# Patient Record
Sex: Female | Born: 1960 | Race: Black or African American | Hispanic: No | Smoking: Current every day smoker
Health system: Southern US, Community
[De-identification: ages and names within clinical notes are randomized; demographics above are authoritative.]

## PROBLEM LIST (undated history)

## (undated) DIAGNOSIS — R51 Headache: Secondary | ICD-10-CM

## (undated) DIAGNOSIS — R519 Headache, unspecified: Secondary | ICD-10-CM

## (undated) DIAGNOSIS — G510 Bell's palsy: Secondary | ICD-10-CM

## (undated) DIAGNOSIS — H3341 Traction detachment of retina, right eye: Secondary | ICD-10-CM

## (undated) DIAGNOSIS — G709 Myoneural disorder, unspecified: Secondary | ICD-10-CM

## (undated) DIAGNOSIS — I10 Essential (primary) hypertension: Secondary | ICD-10-CM

## (undated) HISTORY — DX: Traction detachment of retina, right eye: H33.41

## (undated) HISTORY — DX: Myoneural disorder, unspecified: G70.9

## (undated) HISTORY — DX: Bell's palsy: G51.0

---

## 2007-03-12 HISTORY — PX: ABDOMINAL HYSTERECTOMY: SHX81

## 2008-06-23 DIAGNOSIS — D259 Leiomyoma of uterus, unspecified: Secondary | ICD-10-CM | POA: Insufficient documentation

## 2008-06-23 DIAGNOSIS — D500 Iron deficiency anemia secondary to blood loss (chronic): Secondary | ICD-10-CM | POA: Insufficient documentation

## 2009-11-19 DIAGNOSIS — R7301 Impaired fasting glucose: Secondary | ICD-10-CM | POA: Insufficient documentation

## 2009-11-19 DIAGNOSIS — E559 Vitamin D deficiency, unspecified: Secondary | ICD-10-CM | POA: Insufficient documentation

## 2009-11-19 DIAGNOSIS — N393 Stress incontinence (female) (male): Secondary | ICD-10-CM | POA: Insufficient documentation

## 2009-11-19 DIAGNOSIS — G4709 Other insomnia: Secondary | ICD-10-CM | POA: Insufficient documentation

## 2009-11-19 DIAGNOSIS — K219 Gastro-esophageal reflux disease without esophagitis: Secondary | ICD-10-CM | POA: Insufficient documentation

## 2009-11-19 DIAGNOSIS — K449 Diaphragmatic hernia without obstruction or gangrene: Secondary | ICD-10-CM | POA: Insufficient documentation

## 2009-11-19 DIAGNOSIS — G9009 Other idiopathic peripheral autonomic neuropathy: Secondary | ICD-10-CM | POA: Insufficient documentation

## 2009-11-19 DIAGNOSIS — K5730 Diverticulosis of large intestine without perforation or abscess without bleeding: Secondary | ICD-10-CM | POA: Insufficient documentation

## 2012-11-14 ENCOUNTER — Encounter (HOSPITAL_COMMUNITY): Payer: Self-pay | Admitting: Emergency Medicine

## 2012-11-14 ENCOUNTER — Emergency Department (HOSPITAL_COMMUNITY)
Admission: EM | Admit: 2012-11-14 | Discharge: 2012-11-14 | Disposition: A | Discharge status: Home / Self Care | Payer: Self-pay | Attending: Emergency Medicine | Admitting: Emergency Medicine

## 2012-11-14 DIAGNOSIS — I10 Essential (primary) hypertension: Secondary | ICD-10-CM | POA: Insufficient documentation

## 2012-11-14 DIAGNOSIS — F17200 Nicotine dependence, unspecified, uncomplicated: Secondary | ICD-10-CM | POA: Insufficient documentation

## 2012-11-14 DIAGNOSIS — Z3202 Encounter for pregnancy test, result negative: Secondary | ICD-10-CM | POA: Insufficient documentation

## 2012-11-14 DIAGNOSIS — A599 Trichomoniasis, unspecified: Secondary | ICD-10-CM | POA: Insufficient documentation

## 2012-11-14 DIAGNOSIS — H109 Unspecified conjunctivitis: Secondary | ICD-10-CM | POA: Insufficient documentation

## 2012-11-14 HISTORY — DX: Essential (primary) hypertension: I10

## 2012-11-14 LAB — PREGNANCY, URINE: Preg Test, Ur: NEGATIVE

## 2012-11-14 LAB — URINALYSIS, ROUTINE W REFLEX MICROSCOPIC
Glucose, UA: NEGATIVE mg/dL
Nitrite: NEGATIVE
Protein, ur: NEGATIVE mg/dL
pH: 5 (ref 5.0–8.0)

## 2012-11-14 LAB — WET PREP, GENITAL: Yeast Wet Prep HPF POC: NONE SEEN

## 2012-11-14 LAB — URINE MICROSCOPIC-ADD ON

## 2012-11-14 MED ORDER — METRONIDAZOLE 500 MG PO TABS
2000.0000 mg | ORAL_TABLET | Freq: Once | ORAL | Status: AC
  Administered 2012-11-14: 2000 mg via ORAL
  Filled 2012-11-14: qty 4

## 2012-11-14 MED ORDER — ERYTHROMYCIN 5 MG/GM OP OINT
TOPICAL_OINTMENT | Freq: Once | OPHTHALMIC | Status: AC
  Administered 2012-11-14: 1 via OPHTHALMIC

## 2012-11-14 NOTE — ED Provider Notes (Signed)
CSN: [id number]     Arrival date & time 11/14/12  1102 History   First MD Initiated Contact with Patient 11/14/12 1122     Chief Complaint  Patient presents with  . Hypertension  . Vaginal Discharge   (Consider location/radiation/quality/duration/timing/severity/associated sxs/prior Treatment) HPI Comments: Pt states that she had red eye redness and drainage that started in the left eye last week and it resolved and now she is having the same out of her right eye:pt state that she hasn't been taking bp medication but she just found out that she has a refill so she thought the eye redness was related to her bp being elevated:pt states that she is also here because she is having yellow vaginal discharge times 2 weeks:pt denies history of std  The history is provided by the patient. No language interpreter was used.    Past Medical History  Diagnosis Date  . Hypertension    History reviewed. No pertinent past surgical history. No family history on file. History  Substance Use Topics  . Smoking status: Current Every Day Smoker  . Smokeless tobacco: Not on file  . Alcohol Use: Yes   OB History   Grav Para Term Preterm Abortions TAB SAB Ect Mult Living                 Review of Systems  Constitutional: Negative.   Eyes: Positive for redness. Negative for photophobia and visual disturbance.  Respiratory: Negative.   Cardiovascular: Negative.     Allergies  Review of patient's allergies indicates not on file.  Home Medications  No current outpatient prescriptions on file. BP 160/89  Pulse 91  Temp(Src) 98.3 F (36.8 C) (Oral)  Resp 16  SpO2 97% Physical Exam  Nursing note and vitals reviewed. Constitutional: She is oriented to person, place, and time. She appears well-developed and well-nourished.  HENT:  Head: Normocephalic and atraumatic.  Eyes: EOM are normal. Pupils are equal, round, and reactive to light. Right conjunctiva is injected. Left conjunctiva is injected.   Neck: Normal range of motion. Neck supple.  Cardiovascular: Normal rate and regular rhythm.   Pulmonary/Chest: Effort normal and breath sounds normal.  Abdominal: Soft. Bowel sounds are normal. There is no tenderness.  Genitourinary:  Yellow vaginal discharge:no cmt  Musculoskeletal: Normal range of motion.  Neurological: She is alert and oriented to person, place, and time.  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    ED Course  Procedures (including critical care time) Labs Review Labs Reviewed  WET PREP, GENITAL - Abnormal; Notable for the following:    Trich, Wet Prep MANY (*)    WBC, Wet Prep HPF POC TOO NUMEROUS TO COUNT (*)    All other components within normal limits  URINALYSIS, ROUTINE W REFLEX MICROSCOPIC - Abnormal; Notable for the following:    Leukocytes, UA SMALL (*)    All other components within normal limits  URINE MICROSCOPIC-ADD ON - Abnormal; Notable for the following:    Squamous Epithelial / LPF FEW (*)    All other components within normal limits  GC/CHLAMYDIA PROBE AMP  PREGNANCY, URINE   Imaging Review No results found.  MDM   1. Conjunctivitis   2. Trichimoniasis    Educated pt on redness or eye not related to bp although stressed the importance of continuing to take medication:will treat conjunctivitis:pt treated for trich here:std cultures sent    Teressa Lower, NP 11/14/12 1305

## 2012-11-14 NOTE — ED Notes (Signed)
Pt. Stated, I need my BP checked and my eyes are red.  I also need to be checked for a vaginal discharge I've had for 2 weeks.

## 2012-11-14 NOTE — ED Notes (Signed)
Requested case manager speak to patient regarding possible resources at this time.

## 2012-11-14 NOTE — ED Provider Notes (Signed)
Medical screening examination/treatment/procedure(s) were performed by non-physician practitioner and as supervising physician I was immediately available for consultation/collaboration.  Shelda Jakes, MD 11/14/12 1311

## 2012-11-14 NOTE — Progress Notes (Signed)
Met patient at bedside.Role of Case manager explained-Patient verbalizes her understanding.Patient reports  Increased eye drainage / redness as reason for ED visit.Patient does not have a PCP. Resource sheet given To patient for the [facility].Communicated to patient I will e-mail clinic to help with PCP set up.Patient will call clinic next week to follow up.Patient educated on [employer] and provided with  A [facility] drug discount card.Patient given a [address] resource card.Teach back method used to  Verify patient understanding of education- resources.No further case manager needs identified.

## 2012-11-16 LAB — URINE CULTURE: Colony Count: 2000

## 2013-08-02 ENCOUNTER — Emergency Department (HOSPITAL_COMMUNITY)
Admission: EM | Admit: 2013-08-02 | Discharge: 2013-08-02 | Disposition: A | Discharge status: Home / Self Care | Payer: No Typology Code available for payment source | Attending: Emergency Medicine | Admitting: Emergency Medicine

## 2013-08-02 ENCOUNTER — Emergency Department (HOSPITAL_COMMUNITY): Payer: No Typology Code available for payment source

## 2013-08-02 ENCOUNTER — Encounter (HOSPITAL_COMMUNITY): Payer: Self-pay | Admitting: Emergency Medicine

## 2013-08-02 DIAGNOSIS — R5383 Other fatigue: Secondary | ICD-10-CM

## 2013-08-02 DIAGNOSIS — I10 Essential (primary) hypertension: Secondary | ICD-10-CM | POA: Insufficient documentation

## 2013-08-02 DIAGNOSIS — J45909 Unspecified asthma, uncomplicated: Secondary | ICD-10-CM

## 2013-08-02 DIAGNOSIS — R42 Dizziness and giddiness: Secondary | ICD-10-CM | POA: Insufficient documentation

## 2013-08-02 DIAGNOSIS — R5381 Other malaise: Secondary | ICD-10-CM | POA: Insufficient documentation

## 2013-08-02 DIAGNOSIS — J45902 Unspecified asthma with status asthmaticus: Secondary | ICD-10-CM | POA: Insufficient documentation

## 2013-08-02 DIAGNOSIS — F17200 Nicotine dependence, unspecified, uncomplicated: Secondary | ICD-10-CM | POA: Insufficient documentation

## 2013-08-02 LAB — CBC WITH DIFFERENTIAL/PLATELET
Basophils Absolute: 0 10*3/uL (ref 0.0–0.1)
Basophils Relative: 0 % (ref 0–1)
Eosinophils Absolute: 0.1 10*3/uL (ref 0.0–0.7)
Eosinophils Relative: 1 % (ref 0–5)
HCT: 43.1 % (ref 36.0–46.0)
Hemoglobin: 14.5 g/dL (ref 12.0–15.0)
LYMPHS ABS: 2.4 10*3/uL (ref 0.7–4.0)
LYMPHS PCT: 31 % (ref 12–46)
MCH: 32.2 pg (ref 26.0–34.0)
MCHC: 33.6 g/dL (ref 30.0–36.0)
MCV: 95.8 fL (ref 78.0–100.0)
Monocytes Absolute: 0.4 10*3/uL (ref 0.1–1.0)
Monocytes Relative: 6 % (ref 3–12)
NEUTROS ABS: 4.7 10*3/uL (ref 1.7–7.7)
NEUTROS PCT: 62 % (ref 43–77)
PLATELETS: 239 10*3/uL (ref 150–400)
RBC: 4.5 MIL/uL (ref 3.87–5.11)
RDW: 13.7 % (ref 11.5–15.5)
WBC: 7.6 10*3/uL (ref 4.0–10.5)

## 2013-08-02 LAB — COMPREHENSIVE METABOLIC PANEL
ALK PHOS: 75 U/L (ref 39–117)
ALT: 16 U/L (ref 0–35)
AST: 23 U/L (ref 0–37)
Albumin: 3.8 g/dL (ref 3.5–5.2)
BUN: 13 mg/dL (ref 6–23)
CO2: 23 meq/L (ref 19–32)
Calcium: 10.3 mg/dL (ref 8.4–10.5)
Chloride: 106 mEq/L (ref 96–112)
Creatinine, Ser: 0.64 mg/dL (ref 0.50–1.10)
GLUCOSE: 114 mg/dL — AB (ref 70–99)
POTASSIUM: 5 meq/L (ref 3.7–5.3)
SODIUM: 141 meq/L (ref 137–147)
TOTAL PROTEIN: 7 g/dL (ref 6.0–8.3)
Total Bilirubin: 0.4 mg/dL (ref 0.3–1.2)

## 2013-08-02 LAB — I-STAT TROPONIN, ED: Troponin i, poc: 0 ng/mL (ref 0.00–0.08)

## 2013-08-02 MED ORDER — LORATADINE 10 MG PO TABS: 10.0000 mg | ORAL_TABLET | Freq: Every day | ORAL | Status: DC

## 2013-08-02 MED ORDER — ALBUTEROL SULFATE HFA 108 (90 BASE) MCG/ACT IN AERS
2.0000 | INHALATION_SPRAY | RESPIRATORY_TRACT | Status: DC | PRN
  Administered 2013-08-02: 2 via RESPIRATORY_TRACT
  Filled 2013-08-02: qty 6.7

## 2013-08-02 NOTE — ED Provider Notes (Signed)
CSN: [id number]     Arrival date & time 08/02/13  1440 History   First MD Initiated Contact with Patient 08/02/13 1739     Chief Complaint  Patient presents with  . Shortness of Breath  . Dizziness     (Consider location/radiation/quality/duration/timing/severity/associated sxs/prior Treatment) HPI  Patient to the ER after having an episode of feeling as though her throat was tightening, it was difficult to get a good breath in and then she started to become dizzy and weak. She reports being outside with her son and his dog for a while when it all started. She said that she no longer has symptoms now after being in the waiting room for awhile. She says that she is allergic to some dogs but does not have asthma. She reports that this has happened a few times before but she doesn't remember what caused it and this is her first time being evaluated for it. She currently is not having any symptoms. Denies having any pain or syncope.   Past Medical History  Diagnosis Date  . Hypertension    History reviewed. No pertinent past surgical history. No family history on file. History  Substance Use Topics  . Smoking status: Current Every Day Smoker    Types: Cigarettes  . Smokeless tobacco: Not on file  . Alcohol Use: Yes   OB History   Grav Para Term Preterm Abortions TAB SAB Ect Mult Living                 Review of Systems   Review of Systems  Gen: no weight loss, fevers, chills, night sweats , + weakness Eyes: no discharge or drainage, no occular pain or visual changes  Nose: no epistaxis or rhinorrhea  Mouth: no dental pain, no sore throat  Neck: no neck pain  Lungs:No wheezing, coughing or hemoptysis, + difficulty breathing, throat tightness. CV: no chest pain, palpitations, dependent edema or orthopnea  Abd: no abdominal pain, nausea, vomiting, diarrhea GU: no dysuria or gross hematuria  MSK:  No muscle weakness or pain Neuro: no headache, no focal neurologic deficits   Skin: no rash or wounds Psyche: no complaints    Allergies  Review of patient's allergies indicates no known allergies.  Home Medications   Prior to Admission medications   Medication Sig Start Date End Date Taking? Authorizing Provider  Aspirin-Salicylamide-Caffeine (BC HEADACHE POWDER PO) Take 1 packet by mouth daily as needed (pain).   Yes Historical Provider, MD   BP 143/81  Pulse 76  Temp(Src) 98.2 F (36.8 C) (Oral)  Resp 18  SpO2 96% Physical Exam  Constitutional: She appears well-developed and well-nourished.  HENT:  Head: Normocephalic and atraumatic.  Eyes: Conjunctivae are normal. Pupils are equal, round, and reactive to light.  Neck: Trachea normal, normal range of motion and full passive range of motion without pain. Neck supple.  Cardiovascular: Normal rate, regular rhythm and normal pulses.   Pulmonary/Chest: Effort normal and breath sounds normal. Chest wall is not dull to percussion. She exhibits no tenderness, no crepitus, no edema, no deformity and no retraction.  Abdominal: Soft. Normal appearance and bowel sounds are normal.  Musculoskeletal: Normal range of motion.  Neurological: She is alert. She has normal strength.  Skin: Skin is warm, dry and intact.  Psychiatric: She has a normal mood and affect. Her speech is normal and behavior is normal. Judgment and thought content normal. Cognition and memory are normal.    ED Course  Procedures (including critical care  time) Labs Review Labs Reviewed  COMPREHENSIVE METABOLIC PANEL - Abnormal; Notable for the following:    Glucose, Bld 114 (*)    All other components within normal limits  CBC WITH DIFFERENTIAL  Randolm Idol, ED    Imaging Review Dg Chest 2 View  08/02/2013   CLINICAL DATA:  Short of breath, dizzy, chest pain  EXAM: CHEST  2 VIEW  COMPARISON:  None.  FINDINGS: The lungs are clear and negative for focal airspace consolidation, pulmonary edema or suspicious pulmonary nodule. No  pleural effusion or pneumothorax. Cardiac and mediastinal contours are within normal limits. No acute fracture or lytic or blastic osseous lesions. The visualized upper abdominal bowel gas pattern is unremarkable.  IMPRESSION: No active cardiopulmonary disease.   Electronically Signed   By: Jacqulynn Cadet M.D.   On: 08/02/2013 15:55     EKG Interpretation None      MDM   Final diagnoses:  Allergy-induced asthma   i watched patient ambulate in exam room and then fat her. Her symptoms resolved after removing herself from the dog and the outdoor elements. She says that this happened sometimes but she never remembers associating it with anything.  She currently has no abnormal physical exam findings. Her lab work was unremarkable her chest x-ray was normal. We'll give her albuterol inhaler and start her on Claritin. She is to follow-up with PCP   53 y.o.Earney [employer] evaluation in the Emergency Department is complete. It has been determined that no acute conditions requiring further emergency intervention are present at this time. The patient/guardian have been advised of the diagnosis and plan. We have discussed signs and symptoms that warrant return to the ED, such as changes or worsening in symptoms.  Vital signs are stable at discharge. Filed Vitals:   08/02/13 1801  BP: 143/81  Pulse: 76  Temp:   Resp: 18    Patient/guardian has voiced understanding and agreed to follow-up with the PCP or specialist.    Linus Mako, PA-C 08/02/13 2346

## 2013-08-02 NOTE — ED Notes (Signed)
MD at bedside. Carlota Raspberry PA

## 2013-08-02 NOTE — Discharge Instructions (Signed)
Bronchospasm, Adult A bronchospasm is a spasm or tightening of the airways going into the lungs. During a bronchospasm breathing becomes more difficult because the airways get smaller. When this happens there can be coughing, a whistling sound when breathing (wheezing), and difficulty breathing. Bronchospasm is often associated with asthma, but not all patients who experience a bronchospasm have asthma. CAUSES  A bronchospasm is caused by inflammation or irritation of the airways. The inflammation or irritation may be triggered by:   Allergies (such as to animals, pollen, food, or mold). Allergens that cause bronchospasm may cause wheezing immediately after exposure or many hours later.   Infection. Viral infections are believed to be the most common cause of bronchospasm.   Exercise.   Irritants (such as pollution, cigarette smoke, strong odors, aerosol sprays, and paint fumes).   Weather changes. Winds increase molds and pollens in the air. Rain refreshes the air by washing irritants out. Cold air may cause inflammation.   Stress and emotional upset.  SIGNS AND SYMPTOMS   Wheezing.   Excessive nighttime coughing.   Frequent or severe coughing with a simple cold.   Chest tightness.   Shortness of breath.  DIAGNOSIS  Bronchospasm is usually diagnosed through a history and physical exam. Tests, such as chest X-rays, are sometimes done to look for other conditions. TREATMENT   Inhaled medicines can be given to open up your airways and help you breathe. The medicines can be given using either an inhaler or a nebulizer machine.  Corticosteroid medicines may be given for severe bronchospasm, usually when it is associated with asthma. HOME CARE INSTRUCTIONS   Always have a plan prepared for seeking medical care. Know when to call your health care provider and local emergency services (911 in the [place] Know where you can access local emergency care.  Only take medicines  as directed by your health care provider.  If you were prescribed an inhaler or nebulizer machine, ask your health care provider to explain how to use it correctly. Always use a spacer with your inhaler if you were given one.  It is necessary to remain calm during an attack. Try to relax and breathe more slowly.  Control your home environment in the following ways:   Change your heating and air conditioning filter at least once a month.   Limit your use of fireplaces and Cobb stoves.  Do not smoke and do not allow smoking in your home.   Avoid exposure to perfumes and fragrances.   Get rid of pests (such as roaches and mice) and their droppings.   Throw away plants if you see mold on them.   Keep your house clean and dust free.   Replace carpet with Z[place], tile, or vinyl flooring. Carpet can trap dander and dust.   Use allergy-proof pillows, mattress covers, and box spring covers.   Wash bed sheets and blankets every week in hot water and dry them in a dryer.   Use blankets that are made of polyester or cotton.   Wash hands frequently. SEEK MEDICAL CARE IF:   You have muscle aches.   You have chest pain.   The sputum changes from clear or white to yellow, green, gray, or bloody.   The sputum you cough up gets thicker.   There are problems that may be related to the medicine you are given, such as a rash, itching, swelling, or trouble breathing.  SEEK IMMEDIATE MEDICAL CARE IF:   You have worsening wheezing and coughing  even after taking your prescribed medicines.   You have increased difficulty breathing.   You develop severe chest pain. MAKE SURE YOU:   Understand these instructions.  Will watch your condition.  Will get help right away if you are not doing well or get worse. Document Released: 02/28/2003 Document Revised: 10/28/2012 Document Reviewed: 08/17/2012 [facility] Patient Information 2014 [place].  How to Use an  Inhaler Proper inhaler technique is very important. Good technique ensures that the medicine reaches the lungs. Poor technique results in depositing the medicine on the tongue and back of the throat rather than in the airways. If you do not use the inhaler with good technique, the medicine will not help you. STEPS TO FOLLOW IF USING AN INHALER WITHOUT AN EXTENSION TUBE 1. Remove the cap from the inhaler. 2. If you are using the inhaler for the first time, you will need to prime it. Shake the inhaler for 5 seconds and release four puffs into the air, away from your face. Ask your health care provider or pharmacist if you have questions about priming your inhaler. 3. Shake the inhaler for 5 seconds before each breath in (inhalation). 4. Position the inhaler so that the top of the canister faces up. 5. Put your index finger on the top of the medicine canister. Your thumb supports the bottom of the inhaler. 6. Open your mouth. 7. Either place the inhaler between your teeth and place your lips tightly around the mouthpiece, or hold the inhaler 1 2 inches away from your open mouth. If you are unsure of which technique to use, ask your health care provider. 8. Breathe out (exhale) normally and as completely as possible. 9. Press the canister down with your index finger to release the medicine. 10. At the same time as the canister is pressed, inhale deeply and slowly until your lungs are completely filled. This should take 4 6 seconds. Keep your tongue down. 11. Hold the medicine in your lungs for 5 10 seconds (10 seconds is best). This helps the medicine get into the small airways of your lungs. 12. Breathe out slowly, through pursed lips. Whistling is an example of pursed lips. 13. Wait at least 15 30 seconds between puffs. Continue with the above steps until you have taken the number of puffs your health care provider has ordered. Do not use the inhaler more than your health care provider tells  you. 14. Replace the cap on the inhaler. 15. Follow the directions from your health care provider or the inhaler insert for cleaning the inhaler. STEPS TO FOLLOW IF USING AN INHALER WITH AN EXTENSION (SPACER) 1. Remove the cap from the inhaler. 2. If you are using the inhaler for the first time, you will need to prime it. Shake the inhaler for 5 seconds and release four puffs into the air, away from your face. Ask your health care provider or pharmacist if you have questions about priming your inhaler. 3. Shake the inhaler for 5 seconds before each breath in (inhalation). 4. Place the open end of the spacer onto the mouthpiece of the inhaler. 5. Position the inhaler so that the top of the canister faces up and the spacer mouthpiece faces you. 6. Put your index finger on the top of the medicine canister. Your thumb supports the bottom of the inhaler and the spacer. 7. Breathe out (exhale) normally and as completely as possible. 8. Immediately after exhaling, place the spacer between your teeth and into your mouth. Close your  lips tightly around the spacer. 9. Press the canister down with your index finger to release the medicine. 10. At the same time as the canister is pressed, inhale deeply and slowly until your lungs are completely filled. This should take 4 6 seconds. Keep your tongue down and out of the way. 11. Hold the medicine in your lungs for 5 10 seconds (10 seconds is best). This helps the medicine get into the small airways of your lungs. Exhale. 12. Repeat inhaling deeply through the spacer mouthpiece. Again hold that breath for up to 10 seconds (10 seconds is best). Exhale slowly. If it is difficult to take this second deep breath through the spacer, breathe normally several times through the spacer. Remove the spacer from your mouth. 13. Wait at least 15 30 seconds between puffs. Continue with the above steps until you have taken the number of puffs your health care provider has  ordered. Do not use the inhaler more than your health care provider tells you. 14. Remove the spacer from the inhaler, and place the cap on the inhaler. 15. Follow the directions from your health care provider or the inhaler insert for cleaning the inhaler and spacer. If you are using different kinds of inhalers, use your quick relief medicine to open the airways 10 15 minutes before using a steroid if instructed to do so by your health care provider. If you are unsure which inhalers to use and the order of using them, ask your health care provider, nurse, or respiratory therapist. If you are using a steroid inhaler, always rinse your mouth with water after your last puff, then gargle and spit out the water. Do not swallow the water. AVOID:  Inhaling before or after starting the spray of medicine. It takes practice to coordinate your breathing with triggering the spray.  Inhaling through the nose (rather than the mouth) when triggering the spray. HOW TO DETERMINE IF YOUR INHALER IS FULL OR NEARLY EMPTY You cannot know when an inhaler is empty by shaking it. A few inhalers are now being made with dose counters. Ask your health care provider for a prescription that has a dose counter if you feel you need that extra help. If your inhaler does not have a counter, ask your health care provider to help you determine the date you need to refill your inhaler. Write the refill date on a calendar or your inhaler canister. Refill your inhaler 7 10 days before it runs out. Be sure to keep an adequate supply of medicine. This includes making sure it is not expired, and that you have a spare inhaler.  SEEK MEDICAL CARE IF:   Your symptoms are only partially relieved with your inhaler.  You are having trouble using your inhaler.  You have some increase in phlegm. SEEK IMMEDIATE MEDICAL CARE IF:   You feel little or no relief with your inhalers. You are still wheezing and are feeling shortness of breath or  tightness in your chest or both.  You have dizziness, headaches, or a fast heart rate.  You have chills, fever, or night sweats.  You have a noticeable increase in phlegm production, or there is blood in the phlegm. MAKE SURE YOU:   Understand these instructions.  Will watch your condition.  Will get help right away if you are not doing well or get worse. Document Released: 02/23/2000 Document Revised: 12/16/2012 Document Reviewed: 09/24/2012 [facility] Patient Information 2014 [place], [place].

## 2013-08-02 NOTE — ED Notes (Signed)
Gave PT education re: inhaler use

## 2013-08-02 NOTE — ED Notes (Addendum)
Pt presents to department for evaluation of SOB. Onset this morning. Denies chest pain at the time. Respirations unlabored. Speaking complete sentences. Pt is alert and oriented x4. Skin clammy upon arrival.

## 2013-08-03 NOTE — ED Provider Notes (Signed)
Medical screening examination/treatment/procedure(s) were performed by non-physician practitioner and as supervising physician I was immediately available for consultation/collaboration.   EKG Interpretation None       Jasper Riling. Alvino Chapel, MD 08/03/13 2355

## 2015-06-11 ENCOUNTER — Emergency Department (HOSPITAL_COMMUNITY)
Admission: EM | Admit: 2015-06-11 | Discharge: 2015-06-11 | Disposition: A | Discharge status: Home / Self Care | Payer: No Typology Code available for payment source | Attending: Emergency Medicine | Admitting: Emergency Medicine

## 2015-06-11 ENCOUNTER — Encounter (HOSPITAL_COMMUNITY): Payer: Self-pay | Admitting: Emergency Medicine

## 2015-06-11 DIAGNOSIS — Z79899 Other long term (current) drug therapy: Secondary | ICD-10-CM | POA: Insufficient documentation

## 2015-06-11 DIAGNOSIS — I10 Essential (primary) hypertension: Secondary | ICD-10-CM | POA: Insufficient documentation

## 2015-06-11 DIAGNOSIS — F17210 Nicotine dependence, cigarettes, uncomplicated: Secondary | ICD-10-CM | POA: Insufficient documentation

## 2015-06-11 DIAGNOSIS — K047 Periapical abscess without sinus: Secondary | ICD-10-CM

## 2015-06-11 DIAGNOSIS — IMO0001 Reserved for inherently not codable concepts without codable children: Secondary | ICD-10-CM

## 2015-06-11 DIAGNOSIS — K029 Dental caries, unspecified: Secondary | ICD-10-CM | POA: Insufficient documentation

## 2015-06-11 DIAGNOSIS — R030 Elevated blood-pressure reading, without diagnosis of hypertension: Secondary | ICD-10-CM

## 2015-06-11 DIAGNOSIS — K08409 Partial loss of teeth, unspecified cause, unspecified class: Secondary | ICD-10-CM | POA: Insufficient documentation

## 2015-06-11 MED ORDER — PENICILLIN V POTASSIUM 500 MG PO TABS: 500.0000 mg | ORAL_TABLET | Freq: Four times a day (QID) | ORAL | Status: AC

## 2015-06-11 MED ORDER — HYDROCHLOROTHIAZIDE 12.5 MG PO TABS: 12.5000 mg | ORAL_TABLET | Freq: Every day | ORAL | Status: DC

## 2015-06-11 MED ORDER — IBUPROFEN 600 MG PO TABS: 600.0000 mg | ORAL_TABLET | Freq: Four times a day (QID) | ORAL | Status: DC | PRN

## 2015-06-11 NOTE — ED Provider Notes (Signed)
CSN: [id number]     Arrival date & time 06/11/15  1101 History   First MD Initiated Contact with Patient 06/11/15 1303     Chief Complaint  Patient presents with  . Facial Swelling     (Consider location/radiation/quality/duration/timing/severity/associated sxs/prior Treatment) HPI Kristi Russell is a 55 y.o. female with history of hypertension, comes in for evaluation of facial swelling. Patient reports over the past 2 days she started to have a mild toothache and noticed her left lower jaw started swelling. She reports she has not had insurance or a job for some time, but recently secured a job and will start receiving benefits on May 1. She has not taken any medications to improve her symptoms. Nothing seems to make the problem better or worse. She denies any overt facial pain, eye pain, fevers or chills, difficulties swallowing or breathing, sore throat, difficulties opening her jaw. She does have elevated blood pressure but denies any headache, numbness or weakness, vision changes, chest pain, shortness of breath, changes in urinary habits.  Past Medical History  Diagnosis Date  . Hypertension    Past Surgical History  Procedure Laterality Date  . Abdominal hysterectomy     No family history on file. Social History  Substance Use Topics  . Smoking status: Current Every Day Smoker    Types: Cigarettes  . Smokeless tobacco: None  . Alcohol Use: Yes   OB History    No data available     Review of Systems A 10 point review of systems was completed and was negative except for pertinent positives and negatives as mentioned in the history of present illness     Allergies  Review of patient's allergies indicates no known allergies.  Home Medications   Prior to Admission medications   Medication Sig Start Date End Date Taking? Authorizing Provider  Aspirin-Salicylamide-Caffeine (BC HEADACHE POWDER PO) Take 1 packet by mouth daily as needed (pain).    Historical Provider, MD   hydrochlorothiazide (HYDRODIURIL) 12.5 MG tablet Take 1 tablet (12.5 mg total) by mouth daily. 06/11/15   Comer Locket, PA-C  ibuprofen (ADVIL,MOTRIN) 600 MG tablet Take 1 tablet (600 mg total) by mouth every 6 (six) hours as needed. 06/11/15   Comer Locket, PA-C  loratadine (CLARITIN) 10 MG tablet Take 1 tablet (10 mg total) by mouth daily. 08/02/13   Delos Haring, PA-C  penicillin v potassium (VEETID) 500 MG tablet Take 1 tablet (500 mg total) by mouth 4 (four) times daily. 06/11/15 06/18/15  Comer Locket, PA-C   BP 193/115 mmHg  Pulse 78  Temp(Src) 98.2 F (36.8 C) (Oral)  Resp 16  Ht 5\' 3"  (1.6 m)  Wt 85.73 kg  BMI 33.49 kg/m2  SpO2 99% Physical Exam  Constitutional: She appears well-developed and well-nourished. No distress.  HENT:  Head: Normocephalic.  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  Mild to moderate diffuse swelling throughout left anterior mandible and maxillary space. No erythema, fluctuance.  Dental Discomfort located to left mandibular canine and premolars. Overall poor dentition. Multiple missing teeth with active caries. Mucous membranes are moist. No unilateral tonsillar swelling, uvula midline, no glossal swelling or elevation. No trismus. No fluctuance or evidence of a drainable abscess. No other evidence of emergent infection, Retropharyngeal or Peritonsillar abscess, Ludwig or Vincents angina. Tolerating secretions well. Patent airway   Eyes: Conjunctivae and EOM are normal. Right eye exhibits no discharge. Left eye exhibits no discharge. No  scleral icterus.  Neck: Normal range of motion. Neck supple.  Pulmonary/Chest: Effort normal. No respiratory distress.  Abdominal: Soft. She exhibits no distension.  Musculoskeletal: Normal range of motion.  Neurological: She is alert.  Skin: She is not diaphoretic.  Psychiatric: She has a normal mood and affect.    ED Course  Procedures  (including critical care time) Labs Review Labs Reviewed - No data to display  Imaging Review No results found. I have personally reviewed and evaluated these images and lab results as part of my medical decision-making.   EKG Interpretation None     Meds given in ED:  Medications - No data to display  Discharge Medication List as of 06/11/2015  2:23 PM    START taking these medications   Details  hydrochlorothiazide (HYDRODIURIL) 12.5 MG tablet Take 1 tablet (12.5 mg total) by mouth daily., Starting 06/11/2015, Until Discontinued, Print    ibuprofen (ADVIL,MOTRIN) 600 MG tablet Take 1 tablet (600 mg total) by mouth every 6 (six) hours as needed., Starting 06/11/2015, Until Discontinued, Print    penicillin v potassium (VEETID) 500 MG tablet Take 1 tablet (500 mg total) by mouth 4 (four) times daily., Starting 06/11/2015, Until Sun 06/18/15, Print       Filed Vitals:   06/11/15 1106 06/11/15 1315 06/11/15 1441  BP: 207/124 189/121 193/115  Pulse: 93 77 78  Temp: 98.2 F (36.8 C)    TempSrc: Oral    Resp: 16 18 16   Height: 5\' 3"  (1.6 m)    Weight: 85.73 kg    SpO2: 100% 98% 99%    MDM  Patient presents for evaluation of facial swelling secondary to dental infection. No evidence of drainable abscess, Ludwig or vincents angina, peritonsillar or retropharyngeal abscess or other deep space infection. No evidence of facial cellulitis. Plan to initiate antibiotic therapy. Patient also has elevated blood pressure, discussed good Rx app, we will initiate antihypertensive medication, she reports she will be up to follow-up with on site doctor at her job next week. Strict return precautions discussed. Given referral for PCP and outpatient dentistry. She verbalizes understanding and agrees with this plan as well as subsequent discharge. Final diagnoses:  Elevated blood pressure  Dental infection        Comer Locket, PA-C 06/11/15 1533  Tanna Furry, MD 06/15/15 [phone]

## 2015-06-11 NOTE — ED Notes (Signed)
Pt presents with L side facial swelling. She advises it started yesterday and was worse when she woke up this morning. Pt states she knows she has a bad tooth on the bottom L side but has not had any dental care in many years. Pt denies pain.

## 2015-06-11 NOTE — Discharge Instructions (Signed)
Take your medications as prescribed. Follow-up with your doctor in the next 3 or 4 days were a blood pressure recheck. He will use to test resource guide to help find a dentist for definitive care. Take all of your antibiotics as prescribed and do not save or share them. Return to the ED for any new, worsening or other concerning symptoms as we discussed.  DASH Eating Plan DASH stands for "Dietary Approaches to Stop Hypertension." The DASH eating plan is a healthy eating plan that has been shown to reduce high blood pressure (hypertension). Additional health benefits may include reducing the risk of type 2 diabetes mellitus, heart disease, and stroke. The DASH eating plan may also help with weight loss. WHAT DO I NEED TO KNOW ABOUT THE DASH EATING PLAN? For the DASH eating plan, you will follow these general guidelines:  Choose foods with a percent daily value for sodium of less than 5% (as listed on the food label).  Use salt-free seasonings or herbs instead of table salt or sea salt.  Check with your health care provider or pharmacist before using salt substitutes.  Eat lower-sodium products, often labeled as "lower sodium" or "no salt added."  Eat fresh foods.  Eat more vegetables, fruits, and low-fat dairy products.  Choose whole grains. Look for the word "whole" as the first word in the ingredient list.  Choose fish and skinless chicken or [place] more often than red meat. Limit fish, poultry, and meat to 6 oz (170 g) each day.  Limit sweets, desserts, sugars, and sugary drinks.  Choose heart-healthy fats.  Limit cheese to 1 oz (28 g) per day.  Eat more home-cooked food and less restaurant, buffet, and fast food.  Limit fried foods.  Cook foods using methods other than frying.  Limit canned vegetables. If you do use them, rinse them well to decrease the sodium.  When eating at a restaurant, ask that your food be prepared with less salt, or no salt if possible. WHAT FOODS  CAN I EAT? Seek help from a dietitian for individual calorie needs. Grains Whole grain or whole wheat bread. Brown rice. Whole grain or whole wheat pasta. Quinoa, bulgur, and whole grain cereals. Low-sodium cereals. Corn or whole wheat flour tortillas. Whole grain cornbread. Whole grain crackers. Low-sodium crackers. Vegetables Fresh or frozen vegetables (raw, steamed, roasted, or grilled). Low-sodium or reduced-sodium tomato and vegetable juices. Low-sodium or reduced-sodium tomato sauce and paste. Low-sodium or reduced-sodium canned vegetables.  Fruits All fresh, canned (in natural juice), or frozen fruits. Meat and Other Protein Products Ground beef (85% or leaner), grass-fed beef, or beef trimmed of fat. Skinless chicken or [place]. Ground chicken or [place]. Pork trimmed of fat. All fish and seafood. Eggs. Dried beans, peas, or lentils. Unsalted nuts and seeds. Unsalted canned beans. Dairy Low-fat dairy products, such as skim or 1% milk, 2% or reduced-fat cheeses, low-fat ricotta or cottage cheese, or plain low-fat yogurt. Low-sodium or reduced-sodium cheeses. Fats and Oils Tub margarines without trans fats. Light or reduced-fat mayonnaise and salad dressings (reduced sodium). Avocado. Safflower, olive, or canola oils. Natural peanut or almond butter. Other Unsalted popcorn and pretzels. The items listed above may not be a complete list of recommended foods or beverages. Contact your dietitian for more options. WHAT FOODS ARE NOT RECOMMENDED? Grains White bread. White pasta. White rice. Refined cornbread. Bagels and croissants. Crackers that contain trans fat. Vegetables Creamed or fried vegetables. Vegetables in a cheese sauce. Regular canned vegetables. Regular canned tomato sauce and  paste. Regular tomato and vegetable juices. Fruits Dried fruits. Canned fruit in light or heavy syrup. Fruit juice. Meat and Other Protein Products Fatty cuts of meat. Ribs, chicken wings, bacon, sausage,  bologna, salami, chitterlings, fatback, hot dogs, bratwurst, and packaged luncheon meats. Salted nuts and seeds. Canned beans with salt. Dairy Whole or 2% milk, cream, half-and-half, and cream cheese. Whole-fat or sweetened yogurt. Full-fat cheeses or blue cheese. Nondairy creamers and whipped toppings. Processed cheese, cheese spreads, or cheese curds. Condiments Onion and garlic salt, seasoned salt, table salt, and sea salt. Canned and packaged gravies. Worcestershire sauce. Tartar sauce. Barbecue sauce. Teriyaki sauce. Soy sauce, including reduced sodium. Steak sauce. Fish sauce. Oyster sauce. Cocktail sauce. Horseradish. Ketchup and mustard. Meat flavorings and tenderizers. Bouillon cubes. Hot sauce. Tabasco sauce. Marinades. Taco seasonings. Relishes. Fats and Oils Butter, stick margarine, lard, shortening, ghee, and bacon fat. Coconut, palm kernel, or palm oils. Regular salad dressings. Other Pickles and olives. Salted popcorn and pretzels. The items listed above may not be a complete list of foods and beverages to avoid. Contact your dietitian for more information. WHERE CAN I FIND MORE INFORMATION? National Heart, Lung, and Blood Institute: travelstabloid.com   This information is not intended to replace advice given to you by your health care provider. Make sure you discuss any questions you have with your health care provider.      [facility]  [address]  [place], [place] [address]  Phone [phone]  [facility] in [place], [place], exemplifies the [employer] vision to improve the health and quality of life of all [place] by Regulatory affairs officer with a passion to care for the underserved and by leading the nation in community-based, service learning oral  health education. We are committed to offering comprehensive general dental services for adults, children and special needs patients in a safe, caring and professional setting.  Appointments: Our clinic is open Monday through Friday 8:00 a.m. until 5:00 p.m. The amount of time scheduled for an appointment depends on the patients specific needs. We ask that you keep your appointed time for care or provide 24-hour notice of all appointment changes. Parents or legal guardians must accompany minor children.  Payment for Services: Medicaid and other insurance plans are welcome. Payment for services is due when services are rendered and may be made by cash or credit card. If you have dental insurance, we will assist you with your claim submission.   Emergencies: Emergency services will be provided Monday through Friday on a walk-in basis. Please arrive early for emergency services. After hours emergency services will be provided for patients of record as required.  Services:  Comprehensive General Dentistry  Childrens Dentistry  Oral Surgery - Extractions  Root Canals  Sealants and Tooth Colored Fillings  Crowns and Bridges  Dentures and Partial Dentures  Implant Services  Periodontal Services and Cleanings  Cosmetic Tooth Whitening  Digital Radiography  3-D/[facility] Beam Imaging   Document Released: 02/14/2011 Document Revised: 03/18/2014 Document Reviewed: 12/30/2012 Elsevier Interactive Patient Education 2016 [place] [employer] is a great source of information about community services available.  Access by dialing 2-1-1 from anywhere in [place], or by website -  CustodianSupply.fi.   Other Local Resources (Updated 03/2015)  Dental  Care   Services    Phone Number and Address  Cost  [facility] For children 43 - 75 years of age:   Cleaning  Tooth brushing/flossing instruction  Sealants,  fillings, crowns  Extractions  Emergency treatment  [phone] [address], [place] [address] Charges based on family income.  Medicaid and some insurance plans accepted.     [facility] - [facility], fillings, crowns  Extractions  Emergency treatment [phone] [address], [place]  Pregnant women 20 years of age or older with a Medicaid card  [facility] - [place]  Cleaning  Sealants, fillings, crowns  Extractions  Emergency treatment [phone] [address] [place], [place] Pregnant women 90 years of age or older with a Medicaid card  [facility] For children 70 - 53 years of age:   Cleaning  Tooth brushing/flossing instruction  Sealants, fillings, crowns  Extractions  Emergency treatment Limited orthodontic services for patients with Medicaid [phone], [place] [address] Medicaid and [facility] Health Choice cover for children up to age 48 and pregnant women.  Parents of children up to age 36 without Medicaid pay a reduced fee at time of service.  [place] For children 72 - 64 years of age:   Cleaning  Tooth brushing/flossing instruction  Sealants, fillings, crowns  Extractions  Emergency treatment Limited orthodontic services for patients with Medicaid [phone] [address], [place].  Medicaid and [place] Health Choice cover for children up to age 59 and pregnant women.  Parents of children up to age 26 without Medicaid pay a reduced fee.  [facility]  Sealants, fillings, crowns  Extractions  Hours: Tuesdays and Thursdays, 4:15 - 8 pm [phone] [address], [place], [place] [address] Services free of charge to [facility] residents ages  18-64 who do not have health insurance, Medicare, [place], or [place] benefits and fall within federal poverty guidelines  [place] care in addition to primary medical care, nutritional counseling, and pharmacy:  Engineer, drilling, fillings, crowns  Extractions                  [phone] [facility], [place], [place], [place], [place], [place] [facility], [place], [place] [facility] [place], [place], [place], most insurance.  Also provides services available to all with fees adjusted based on ability to pay.    [facility]  Cleaning  Tooth brushing/flossing instruction  Sealants, fillings, crowns  Extractions  Emergency treatment Hours: Tuesdays, Thursdays, and Fridays from 8 am to 5 pm by appointment only. [phone] [place], [place] [address] [facility] residents with Medicaid (depending on eligibility) and children with [facility] Health Choice - call for more information.  [facility]  Extractions only  Hours: 2nd and 4th Thursday of each month from 6:30 am - 9 am.   [phone] ext. [place], [place] [address] Ages 43 and older only.  Patients are seen on a first come, first served basis.  [employer] of Dent[facility]  Cleanings  Fillings  Extractions  Orthodontics  Endodontics  Implants/Crowns/Bridges  Complete and partial dentures [phone] [facility], [place] Patients must complete an application for services.  There is often a waiting list.

## 2015-08-10 DIAGNOSIS — H3341 Traction detachment of retina, right eye: Secondary | ICD-10-CM

## 2015-08-10 HISTORY — DX: Traction detachment of retina, right eye: H33.41

## 2015-11-20 ENCOUNTER — Emergency Department (HOSPITAL_COMMUNITY): Payer: Self-pay

## 2015-11-20 ENCOUNTER — Emergency Department (HOSPITAL_COMMUNITY)
Admission: EM | Admit: 2015-11-20 | Discharge: 2015-11-20 | Disposition: A | Discharge status: Home / Self Care | Payer: Self-pay | Attending: Emergency Medicine | Admitting: Emergency Medicine

## 2015-11-20 ENCOUNTER — Encounter (HOSPITAL_COMMUNITY): Payer: Self-pay | Admitting: Emergency Medicine

## 2015-11-20 DIAGNOSIS — I10 Essential (primary) hypertension: Secondary | ICD-10-CM | POA: Insufficient documentation

## 2015-11-20 DIAGNOSIS — F17210 Nicotine dependence, cigarettes, uncomplicated: Secondary | ICD-10-CM | POA: Insufficient documentation

## 2015-11-20 LAB — URINALYSIS, ROUTINE W REFLEX MICROSCOPIC
Bilirubin Urine: NEGATIVE
Glucose, UA: NEGATIVE mg/dL
Hgb urine dipstick: NEGATIVE
Ketones, ur: NEGATIVE mg/dL
LEUKOCYTES UA: NEGATIVE
NITRITE: NEGATIVE
PH: 7.5 (ref 5.0–8.0)
Protein, ur: NEGATIVE mg/dL
SPECIFIC GRAVITY, URINE: 1.008 (ref 1.005–1.030)

## 2015-11-20 LAB — CBC
HEMATOCRIT: 39.4 % (ref 36.0–46.0)
HEMOGLOBIN: 13.1 g/dL (ref 12.0–15.0)
MCH: 32.2 pg (ref 26.0–34.0)
MCHC: 33.2 g/dL (ref 30.0–36.0)
MCV: 96.8 fL (ref 78.0–100.0)
Platelets: 234 10*3/uL (ref 150–400)
RBC: 4.07 MIL/uL (ref 3.87–5.11)
RDW: 13.6 % (ref 11.5–15.5)
WBC: 7.8 10*3/uL (ref 4.0–10.5)

## 2015-11-20 LAB — CBG MONITORING, ED: Glucose-Capillary: 95 mg/dL (ref 65–99)

## 2015-11-20 LAB — BASIC METABOLIC PANEL
ANION GAP: 4 — AB (ref 5–15)
BUN: 7 mg/dL (ref 6–20)
CHLORIDE: 108 mmol/L (ref 101–111)
CO2: 29 mmol/L (ref 22–32)
Calcium: 10.5 mg/dL — ABNORMAL HIGH (ref 8.9–10.3)
Creatinine, Ser: 0.58 mg/dL (ref 0.44–1.00)
GFR calc Af Amer: >60 mL/min (ref >60)
Glucose, Bld: 106 mg/dL — ABNORMAL HIGH (ref 65–99)
POTASSIUM: 3.6 mmol/L (ref 3.5–5.1)
SODIUM: 141 mmol/L (ref 135–145)

## 2015-11-20 MED ORDER — LOSARTAN POTASSIUM 50 MG PO TABS
100.0000 mg | ORAL_TABLET | Freq: Once | ORAL | Status: AC
  Administered 2015-11-20: 100 mg via ORAL
  Filled 2015-11-20: qty 2

## 2015-11-20 MED ORDER — HYDROCHLOROTHIAZIDE 25 MG PO TABS
25.0000 mg | ORAL_TABLET | Freq: Once | ORAL | Status: AC
  Administered 2015-11-20: 25 mg via ORAL
  Filled 2015-11-20: qty 1

## 2015-11-20 MED ORDER — LOSARTAN POTASSIUM-HCTZ 100-25 MG PO TABS: 1.0000 | ORAL_TABLET | Freq: Every day | ORAL | 0 refills | Status: DC | PRN

## 2015-11-20 NOTE — ED Provider Notes (Signed)
[facility] DEPT Provider Note   CSN: [id number] Arrival date & time: 11/20/15  1336     History   Chief Complaint Chief Complaint  Patient presents with  . Hypertension  . Dizziness    HPI Kristi Russell is a 55 y.o. female.  HPI  Pt presenting with complaints of hypertension.  She states she feels more fatigued and at times dizzy and lightheaded.  She states she sometimes feels her brain is in a fog.  She has hx of hypertension.  Tries not to take her BP medications unless she feels bad to help them to last longer.  No chest pain, no shortness of breath.  No focal weakness or numbness.  There are no other associated systemic symptoms, there are no other alleviating or modifying factors.   Past Medical History:  Diagnosis Date  . Hypertension     There are no active problems to display for this patient.   Past Surgical History:  Procedure Laterality Date  . ABDOMINAL HYSTERECTOMY      OB History    No data available       Home Medications    Prior to Admission medications   Medication Sig Start Date End Date Taking? Authorizing Provider  diphenhydramine-acetaminophen (TYLENOL PM) 25-500 MG TABS tablet Take 2 tablets by mouth at bedtime as needed (for pain/sleep).   Yes Historical Provider, MD  hydrochlorothiazide (HYDRODIURIL) 12.5 MG tablet Take 1 tablet (12.5 mg total) by mouth daily. Patient not taking: Reported on 11/20/2015 06/11/15   Comer Locket, PA-C  ibuprofen (ADVIL,MOTRIN) 600 MG tablet Take 1 tablet (600 mg total) by mouth every 6 (six) hours as needed. Patient not taking: Reported on 11/20/2015 06/11/15   Comer Locket, PA-C  losartan-hydrochlorothiazide (HYZAAR) 100-25 MG tablet Take 1 tablet by mouth daily as needed (for high blood pressure). 11/20/15   Alfonzo Beers, MD    Family History No family history on file.  Social History Social History  Substance Use Topics  . Smoking status: Current Every Day Smoker    Types: Cigarettes  .  Smokeless tobacco: Not on file  . Alcohol use Yes     Allergies   Review of patient's allergies indicates no known allergies.   Review of Systems Review of Systems  ROS reviewed and all otherwise negative except for mentioned in HPI   Physical Exam Updated Vital Signs BP (!) 175/106   Pulse 63   Temp 98.9 F (37.2 C) (Oral)   Resp 19   Ht 5\' 3"  (1.6 m)   Wt 83.5 kg   SpO2 95%   BMI 32.59 kg/m  vitlals reviewed Physical Exam Physical Examination: General appearance - alert, well appearing, and in no distress Mental status - alert, oriented to person, place, and time Eyes - pupils equal and reactive, extraocular eye movements intact Mouth - mucous membranes moist, pharynx normal without lesions Neck - supple, no significant adenopathy Chest - clear to auscultation, no wheezes, rales or rhonchi, symmetric air entry Heart - normal rate, regular rhythm, normal S1, S2, no murmurs, rubs, clicks or gallops Abdomen - soft, nontender, nondistended, no masses or organomegaly Neurological - alert, oriented x 3, 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 Extremities - peripheral pulses normal, no pedal edema, no clubbing or cyanosis Skin - normal coloration and turgor, no rashes  ED Treatments / Results  Labs (all labs ordered are listed, but only abnormal results are displayed) Labs Reviewed  BASIC METABOLIC PANEL -  Abnormal; Notable for the following:       Result Value   Glucose, Bld 106 (*)    Calcium 10.5 (*)    Anion gap 4 (*)    All other components within normal limits  CBC  URINALYSIS, ROUTINE W REFLEX MICROSCOPIC (NOT AT [facility])  CBG MONITORING, ED    EKG  EKG Interpretation  Date/Time:  Monday November 20 2015 14:43:57 EDT Ventricular Rate:  64 PR Interval:    QRS Duration: 83 QT Interval:  406 QTC Calculation: 419 R Axis:   -62 Text Interpretation:  Sinus rhythm Left anterior fascicular block No significant  change since last tracing Confirmed by Canary Brim  MD, Ronnae Kaser [phone]) on 11/20/2015 5:40:13 PM       Radiology No results found.  Procedures Procedures (including critical care time)  Medications Ordered in ED Medications  losartan (COZAAR) tablet 100 mg (100 mg Oral Given 11/20/15 1551)  hydrochlorothiazide (HYDRODIURIL) tablet 25 mg (25 mg Oral Given 11/20/15 1551)     Initial Impression / Assessment and Plan / ED Course  I have reviewed the triage vital signs and the nursing notes.  Pertinent labs & imaging results that were available during my care of the patient were reviewed by me and considered in my medical decision making (see chart for details).  Clinical Course   Pt with hypertension.  Normal neuro exam (with the exception of chronic facial findings as noted above) and head CT negative.  No signs of end organ damage.  D/w patient the importance of taking bp meds daily- d/w case management to help her get her meds- they have given her information to make her meds less expensive.  Advised f/u with PMD for bp recheck in the next 1-2 weeks.  Discharged with strict return precautions.  Pt agreeable with plan.   Final Clinical Impressions(s) / ED Diagnoses   Final diagnoses:  Essential hypertension    New Prescriptions Discharge Medication List as of 11/20/2015  4:48 PM       Alfonzo Beers, MD 11/24/15 1334

## 2015-11-20 NOTE — Progress Notes (Signed)
CSW notified ED CM of consult for medication assistance.   Kingsley Spittle, [place] Clinical Social Worker [phone]

## 2015-11-20 NOTE — Progress Notes (Signed)
Entered in d/c instructions  www.goodrx.com     please use this website in future with any medications to get discount cost for patients without insurance  Take the coupon to the pharmacy to get the discount cost listed for your medications    Please use the resources provided to you in emergency room by case manager to assist you're your choice of doctor for follow up     These [place] uninsured resources provide possible primary care providers, resources for discounted medications, housing, dental resources, affordable care act information, plus other resources for [employer]    Instructions: A referral for you has been sent to [employer] for community care network if you have not received a call in 3 days you may contact them Call Sylvie Farrier at [email or web address]

## 2015-11-20 NOTE — Progress Notes (Signed)
CM spoke with pt who confirms uninsured [employer] resident with no pcp.  CM discussed and provided written information to assist pt with determining choice for uninsured accepting pcps, discussed the importance of pcp vs EDP services for f/u care, www.needymeds.org, www.goodrx.com, discounted pharmacies and other [employer] such as [employer] [facility] [employer], affordable care act, financial assistance, uninsured dental services, [place] med assist, DSS and  health department  Reviewed resources for [employer] uninsured accepting pcps like [facility] Blossom, family medicine at [employer], [facility] [place] [facility], [facility], local urgent care centers, [facility], [facility] family practice, general medical clinics, family services of the [place], [facility] urgent care plus others, medication resources, [facility] out patient pharmacies and housing Pt voiced understanding and appreciation of resources provided   Provided [facility] contact information Pt agreed to a referral Cm completed referral Pt to be contact by [facility] clinical liaison ED CM consulted by EDP,Linker for medication assistance   CM reviewed EPIC notes and chart review information CM spoke with the pt about [facility] MATCH program  BUT pt prefers not to use MATCH at this time and to use [place] program <$15 cost for Lorsartan and Hydrodruil

## 2015-11-20 NOTE — ED Triage Notes (Addendum)
Pt has hx of high blood pressure and hasn't been taking medication for ti due to loss of insurance. Pt presents to ED with hypertension and c/o dizziness and a headache. Denies N/V. A&Ox4 and ambulatory. Pt also c/o intermittent difficulty speaking.

## 2015-11-20 NOTE — Discharge Instructions (Signed)
Return to the ED with any concerns including difficulty breathing, chest pain, weakness of arms or legs, changes in vision or speech, decreased level of alertness/lethargy, or any other alarming symptoms

## 2015-11-20 NOTE — ED Notes (Signed)
Patient transported to CT 

## 2016-01-12 ENCOUNTER — Encounter: Payer: Self-pay | Admitting: Internal Medicine

## 2016-01-12 ENCOUNTER — Ambulatory Visit (INDEPENDENT_AMBULATORY_CARE_PROVIDER_SITE_OTHER): Payer: Self-pay | Admitting: Internal Medicine

## 2016-01-12 ENCOUNTER — Other Ambulatory Visit: Payer: Self-pay | Admitting: Internal Medicine

## 2016-01-12 VITALS — BP 170/110 | HR 84 | Resp 18 | Ht 63.0 in | Wt 187.0 lb

## 2016-01-12 DIAGNOSIS — Z1231 Encounter for screening mammogram for malignant neoplasm of breast: Secondary | ICD-10-CM

## 2016-01-12 DIAGNOSIS — Z1239 Encounter for other screening for malignant neoplasm of breast: Secondary | ICD-10-CM

## 2016-01-12 DIAGNOSIS — H3320 Serous retinal detachment, unspecified eye: Secondary | ICD-10-CM | POA: Insufficient documentation

## 2016-01-12 DIAGNOSIS — I10 Essential (primary) hypertension: Secondary | ICD-10-CM

## 2016-01-12 DIAGNOSIS — F341 Dysthymic disorder: Secondary | ICD-10-CM

## 2016-01-12 DIAGNOSIS — K029 Dental caries, unspecified: Secondary | ICD-10-CM

## 2016-01-12 DIAGNOSIS — H3321 Serous retinal detachment, right eye: Secondary | ICD-10-CM

## 2016-01-12 MED ORDER — LOSARTAN POTASSIUM-HCTZ 100-25 MG PO TABS: ORAL_TABLET | ORAL | 11 refills | Status: DC

## 2016-01-12 MED ORDER — LOSARTAN POTASSIUM-HCTZ 100-25 MG PO TABS: 1.0000 | ORAL_TABLET | Freq: Every day | ORAL | 11 refills | Status: DC | PRN

## 2016-01-12 MED ORDER — LOSARTAN POTASSIUM-HCTZ 100-25 MG PO TABS: 1.0000 | ORAL_TABLET | Freq: Every day | ORAL | 0 refills | Status: DC | PRN

## 2016-01-12 NOTE — Patient Instructions (Signed)
Drink a glass of water before every meal Drink 6-8 glasses of water daily Eat three meals daily Eat a protein and healthy fat with every meal (eggs,fish, chicken, [place] and limit red meats) Eat 5 servings of vegetables daily, mix the colors Eat 2 servings of fruit daily with skin, if skin is edible Use smaller plates Put food/utensils down as you chew and swallow each bite Eat at a table with friends/family at least once daily, no TV Do not eat in front of the TV  Find someone to walk with or do something physically active

## 2016-01-12 NOTE — Progress Notes (Signed)
Subjective:    Patient ID: Kristi Russell, female    DOB: 1960-05-26, 55 y.o.   MRN: [id number]  HPI   1.  Essential Hypertension:  Diagnosed in 1990s.  Has been off medication for a couple of weeks.  Lost coverage for medication when lost job early summer.  Was taking Losartan/HCTZ 100 mg/25 mg from ED for about 30 days.  Her bp was improving.  Previously on HCTZ 12.5 mg with Dr. Ernie Hew here in [place] before lost coverage.  2.  Right Retinal Detachment:  Started having "cobweb"  Visions beginning of June.  Was seen by Dr. Truman Hayward on [place].  Could not afford to go back and have further treatment.  Continues with this in her right peripheral vision.  3.  Dental pain:  Mouth is a mess.  Has not been to dentist in years.    4.  Needs Mammogram and pap smear for many year.  Always normal in past.  Current Meds  Medication Sig  . ibuprofen (ADVIL,MOTRIN) 600 MG tablet Take 1 tablet (600 mg total) by mouth every 6 (six) hours as needed.   No Known Allergies   Past Medical History:  Diagnosis Date  . Hypertension 1990s  . 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 Truman Hayward on [place].  Could not afford to go back for treatment.    Past Surgical History:  Procedure Laterality Date  . ABDOMINAL HYSTERECTOMY  2009   She thinks TAH/BSO:  [place].  Dr. Lacinda Axon    Family History  Problem Relation Age of Onset  . Heart disease Mother   . Hypertension Mother   . Stroke Mother     Sudden cardiac arrest vs. stroke  . Diabetes Sister   . Hypertension Sister   . Kidney disease Brother     on Dialysis  . Hypertension Daughter   . Obstructive Sleep Apnea Brother    Social History   Social History  . Marital status: Divorced    Spouse name: N/A  . Number of children: 3  . Years of education: 2 years college   Occupational History  . unemployed     Unemployed since June  2017.  Worked at [employer] in housekeeping   Social History Main Topics  . Smoking status: Current Every Day Smoker    Packs/day: 0.50    Years: 38.00    Types: Cigarettes  . Smokeless tobacco: Never Used  . Alcohol use Yes     Comment: 3 40 ounce malt liquor per week.  . Drug use: No  . Sexual activity: No   Other Topics Concern  . Not on file   Social History Narrative   Originally form [place]   Born in [place].   Moved to [place] since 1989 to be near mother and have daughter go to school out of the city.   Has been in [place] for 4 years.   Daughter is a Investment banker, operational here.   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          Review of Systems     Objective:   Physical Exam NAD HEENT:  PERRL, EOMI, unable to visualize eyegrounds well, TMs pearly gray, throat without injection.   Significant loss of teeth and decay with many fillings. Neck:  Supple, No adenopathy, no thyromegaly Chest:  CTA CV:  RRR with normal S1 and S2, No S3, S4 or murmur Carotid, radial and DP pulses normal and equal.  No LE edema Abd:  S, NT, No HSM or mass, + BS       Assessment & Plan:  1.  Essential Hypertension:  Restart Losartan/HCTZ 100/25 mg to [place].  Send for records from  Dr. Ernie Hew  2.  Hx right retinal detachment:  Reportedly no treatment as of yet.  Referral to [facility] for evaluation and treatment as none available locally she can afford.   Financial assistance papers given.  3.  Dental Decay:  Dental referral  4.  Depression/Dysthymia-reactive:  Referral to Macie Burows, LCSW as not available to see patient this morning.  5.Hyperglycemia:  Nonfasting today--return in 2 weeks for bp check along with fasting labs --A1C, FLP, BMP.  Had 106 glucose in ED last month.  6.  HM:  Mammogram

## 2016-01-17 ENCOUNTER — Telehealth: Payer: Self-pay | Admitting: Licensed Clinical Social Worker

## 2016-01-17 NOTE — Telephone Encounter (Signed)
Called Kristi Russell to talk about counseling services that are available at [facility].  Kristi Russell seemed ambivalent about counseling.

## 2016-02-07 ENCOUNTER — Ambulatory Visit: Payer: Self-pay | Admitting: Internal Medicine

## 2016-03-15 ENCOUNTER — Ambulatory Visit: Payer: Self-pay | Admitting: Internal Medicine

## 2016-05-15 ENCOUNTER — Other Ambulatory Visit: Payer: Self-pay | Admitting: Obstetrics and Gynecology

## 2016-05-15 DIAGNOSIS — Z1231 Encounter for screening mammogram for malignant neoplasm of breast: Secondary | ICD-10-CM

## 2016-05-30 ENCOUNTER — Encounter (HOSPITAL_COMMUNITY): Payer: Self-pay

## 2016-05-30 ENCOUNTER — Ambulatory Visit (HOSPITAL_COMMUNITY)
Admission: RE | Admit: 2016-05-30 | Discharge: 2016-05-30 | Disposition: A | Discharge status: Home / Self Care | Payer: Self-pay | Source: Ambulatory Visit | Attending: Obstetrics and Gynecology | Admitting: Obstetrics and Gynecology

## 2016-05-30 ENCOUNTER — Ambulatory Visit
Admission: RE | Admit: 2016-05-30 | Discharge: 2016-05-30 | Disposition: A | Discharge status: Home / Self Care | Payer: No Typology Code available for payment source | Source: Ambulatory Visit | Attending: Obstetrics and Gynecology | Admitting: Obstetrics and Gynecology

## 2016-05-30 VITALS — BP 138/70 | Temp 98.7°F | Ht 63.0 in | Wt 196.8 lb

## 2016-05-30 DIAGNOSIS — Z1239 Encounter for other screening for malignant neoplasm of breast: Secondary | ICD-10-CM

## 2016-05-30 DIAGNOSIS — Z1231 Encounter for screening mammogram for malignant neoplasm of breast: Secondary | ICD-10-CM

## 2016-05-30 NOTE — Progress Notes (Signed)
No complaints today.   Pap Smear: Pap smear not completed today. Last Pap smear was around 10 years ago and normal per patient. Per patient has no history of an abnormal Pap smear. Patient has a history of a hysterectomy in 2009 due to fibroids. Patient no longer needs Pap smears due to her history of a hysterectomy for benign reasons. Patient no longer needs Pap smears due to her history of a hysterectomy for benign reasons per BCCCP and ACOG guidelines. No Pap smear results are in EPIC.  Physical exam: Breasts Breasts symmetrical. No skin abnormalities bilateral breasts. No nipple retraction bilateral breasts. No nipple discharge bilateral breasts. No lymphadenopathy. No lumps palpated bilateral breasts. No complaints of pain or tenderness on exam. Referred patient to the [facility] for a screening mammogram. Appointment scheduled for Thursday, May 30, 2016 at 1600.       Pelvic/Bimanual No Pap smear completed today since patient has a history of a hysterectomy for benign reasons. Pap smear not indicated per BCCCP guidelines.   Smoking History: Patient has never smoked.  Patient Navigation: Patient education provided. Access to services provided for patient through [place] program.   Colorectal Cancer Screening: Per patient has never had a colonoscopy completed. No complaints today.

## 2016-05-30 NOTE — Patient Instructions (Signed)
Explained breast self awareness with Cherre Robins. Patient did not need a Pap smear today due to patient has a history of a hysterectomy for benign reasons. Let patient know that she no longer needs Pap smears due to her history of a hysterectomy for benign reasons. Referred patient to the [facility] for a screening mammogram. Appointment scheduled for Thursday, May 30, 2016 at 1600. Let patient know the [facility] will follow up with her within the next couple weeks with results of mammogram by letter or phone. Cherre Robins verbalized understanding.  [place], Arvil Chaco, RN 3:44 PM

## 2016-06-04 ENCOUNTER — Encounter (HOSPITAL_COMMUNITY): Payer: Self-pay | Admitting: *Deleted

## 2016-06-27 ENCOUNTER — Other Ambulatory Visit: Payer: Self-pay

## 2016-07-03 ENCOUNTER — Ambulatory Visit: Payer: Self-pay | Admitting: Internal Medicine

## 2016-07-05 ENCOUNTER — Ambulatory Visit: Payer: No Typology Code available for payment source

## 2016-07-22 ENCOUNTER — Telehealth: Payer: Self-pay | Admitting: Internal Medicine

## 2016-07-22 ENCOUNTER — Other Ambulatory Visit: Payer: Self-pay

## 2016-07-22 MED ORDER — LOSARTAN POTASSIUM-HCTZ 100-25 MG PO TABS: ORAL_TABLET | ORAL | 2 refills | Status: DC

## 2016-07-22 NOTE — Telephone Encounter (Signed)
To Mrs. Pat to notify patient Rx sent to pharmacy

## 2016-07-22 NOTE — Telephone Encounter (Signed)
Patient needs refill of Losartan-hydrochlorothiazide.  Patient is out of town and [employer] is expired.  Would like Rx called into [facility], [address], [place], [place]  [address].  The number to pharmacy is [phone].

## 2016-07-31 NOTE — Telephone Encounter (Signed)
Patient was notified.

## 2016-10-30 ENCOUNTER — Telehealth (HOSPITAL_COMMUNITY): Payer: Self-pay

## 2016-10-30 NOTE — Telephone Encounter (Signed)
Called patient to remind her about the at home Fit Test that was given to the patient in [place] on 05/30/16. Patient asked if I could send her a new one. I let the patient know that I would put on in the mail today. She said she would complete it as soon as she got it and send it back to [place].

## 2016-12-07 DIAGNOSIS — F17200 Nicotine dependence, unspecified, uncomplicated: Secondary | ICD-10-CM | POA: Insufficient documentation

## 2017-01-14 ENCOUNTER — Other Ambulatory Visit: Payer: Self-pay

## 2017-01-14 MED ORDER — LOSARTAN POTASSIUM-HCTZ 100-25 MG PO TABS: ORAL_TABLET | ORAL | 11 refills | Status: DC

## 2017-02-24 ENCOUNTER — Ambulatory Visit (INDEPENDENT_AMBULATORY_CARE_PROVIDER_SITE_OTHER): Payer: Self-pay | Admitting: Internal Medicine

## 2017-02-24 ENCOUNTER — Encounter: Payer: Self-pay | Admitting: Internal Medicine

## 2017-02-24 VITALS — BP 138/88 | HR 72 | Temp 98.1°F | Resp 12 | Ht 63.0 in | Wt 193.0 lb

## 2017-02-24 DIAGNOSIS — K056 Periodontal disease, unspecified: Secondary | ICD-10-CM

## 2017-02-24 DIAGNOSIS — K029 Dental caries, unspecified: Secondary | ICD-10-CM

## 2017-02-24 DIAGNOSIS — I10 Essential (primary) hypertension: Secondary | ICD-10-CM

## 2017-02-24 DIAGNOSIS — R05 Cough: Secondary | ICD-10-CM

## 2017-02-24 DIAGNOSIS — G510 Bell's palsy: Secondary | ICD-10-CM

## 2017-02-24 DIAGNOSIS — H04122 Dry eye syndrome of left lacrimal gland: Secondary | ICD-10-CM

## 2017-02-24 DIAGNOSIS — R059 Cough, unspecified: Secondary | ICD-10-CM

## 2017-02-24 DIAGNOSIS — J3089 Other allergic rhinitis: Secondary | ICD-10-CM

## 2017-02-24 HISTORY — DX: Bell's palsy: G51.0

## 2017-02-24 MED ORDER — ARTIFICIAL TEARS OPHTHALMIC OINT: TOPICAL_OINTMENT | OPHTHALMIC | 11 refills | Status: AC

## 2017-02-24 MED ORDER — VARENICLINE TARTRATE 0.5 MG X 11 & 1 MG X 42 PO MISC: ORAL | 0 refills | Status: DC

## 2017-02-24 MED ORDER — VARENICLINE TARTRATE 1 MG PO TABS: 1.0000 mg | ORAL_TABLET | Freq: Two times a day (BID) | ORAL | 1 refills | Status: DC

## 2017-02-24 MED ORDER — CETIRIZINE HCL 10 MG PO TABS
10.0000 mg | ORAL_TABLET | Freq: Every day | ORAL | 11 refills | Status: DC
Start: 2017-02-24 — End: 2018-03-16

## 2017-02-24 MED ORDER — MOMETASONE FUROATE 50 MCG/ACT NA SUSP: NASAL | 12 refills | Status: DC

## 2017-02-24 NOTE — Addendum Note (Signed)
Addended by: Marcelino Duster on: 02/24/2017 12:23 PM   Modules accepted: Orders

## 2017-02-24 NOTE — Patient Instructions (Signed)
Start Chantix with starter pack.  Once done with starter pack, you will have 2 months of the 1 mg tabs twice daily for 2 more month. Stop smoking on day #8  Call for a follow up appointment to be seen about 2 weeks after starting the medication.  So once you have the medication, call to set that up.

## 2017-02-24 NOTE — Addendum Note (Signed)
Addended by: Marcelino Duster on: 02/24/2017 12:13 PM   Modules accepted: Orders

## 2017-02-24 NOTE — Progress Notes (Signed)
   Subjective:    Patient ID: Kristi Russell, female    DOB: 01-02-1961, 56 y.o.   MRN: [id number]  HPI   Here after long hiatus  1.  Cough:  Started with with itchy right ear and itchy throat.  Was cold at the time.  She does have problems with allergies this time of year.   Sounds like she was taking a short acting antihistamine--every 4 hours.  Has never tried Zyrtec or other long acting antihistamine.  Now with runny eyes and nose--clear.  No itching.  Not much in way of sneezing. Her gums are bleeding with bad teeth, which has been a chronic issue for her.  No pain with gums or teeth, even with chewing--later states maybe mild sensitivity with teeth.  She did not get into dentist last year when referral sent.  2. Tobacco Use:  Has smoked since age 82 yo.  Smokes on average about 1/2 ppd.  Generally, occurs when around people drinking--maybe once weekly.   Has only tried to quit "cold [place]" before.   No history of mental health issues, specifically, depression or anxiety.  No family history.  No family history of seizure disorder and patient without any history of seizure.  3.  Hypertension:  Taking Losartan/HCTZ regularly.  No problems.  Current Meds  Medication Sig  . losartan-hydrochlorothiazide (HYZAAR) 100-25 MG tablet 1 tab by mouth daily    No Known Allergies   Review of Systems     Objective:   Physical Exam   NAD Congested cough HEENT:  PERRL, EOMI, left eye unable to fully close with blink, mild left facial droop.  Both eyes with clear watering, but much more prominent on left. RR + OU, no conjunctival injection.  TMs pearly gray, throat not well visualized.  Diffuse gingival recession, dental decay and significant tartar build up on gum line around prominent dental roots.   Nasal mucosa boggy with copious clear discharge.  NT over sinuses. Neck:  Supple, No adenopathy Chest:  CTA CV:  RRR without murmur or rub, radial pulses normal and equal. LE:  No  edema.        Assessment & Plan:  1.  Allergies:  Discussed hypoallergenic pillow and mattress covers, to clean bedclothes and wipe down mattress and pillow covers weekly.  Vacuum twice weekly. Avoid leaves outside. Zytrtec 10 mg daily Nasonex 2 sprays each nostril daily. Follow up in 1 month.  Call if worsens or no improvement at all in 2 weeks.  2.  Essential Hypertension:  Controlled with Losartan/HCTZ  3.  Tobacco abuse:  Would like to try Chantix.  Discussed needs to get rid of paraphernalia when quits Day 8 of Chantix starter pack.   To call when actually gets medication for follow up 2 weeks later to be certain does not have adverse psychoneurologic reaction.  To stop if does.  4.  Protection of left eye:  Lacrilube or the like at bedtime to eye, patch closed.

## 2017-03-09 ENCOUNTER — Inpatient Hospital Stay (HOSPITAL_COMMUNITY)
Admission: EM | Admit: 2017-03-09 | Discharge: 2017-03-20 | DRG: 330 | Disposition: A | Discharge status: Home / Self Care | Payer: Self-pay | Attending: General Surgery | Admitting: General Surgery

## 2017-03-09 ENCOUNTER — Emergency Department (HOSPITAL_COMMUNITY): Payer: Self-pay | Admitting: Anesthesiology

## 2017-03-09 ENCOUNTER — Other Ambulatory Visit: Payer: Self-pay

## 2017-03-09 ENCOUNTER — Emergency Department (HOSPITAL_COMMUNITY): Payer: Self-pay

## 2017-03-09 ENCOUNTER — Encounter (HOSPITAL_COMMUNITY): Admission: EM | Disposition: A | Discharge status: Home / Self Care | Payer: Self-pay | Source: Home / Self Care

## 2017-03-09 ENCOUNTER — Encounter (HOSPITAL_COMMUNITY): Payer: Self-pay | Admitting: Emergency Medicine

## 2017-03-09 DIAGNOSIS — K9189 Other postprocedural complications and disorders of digestive system: Secondary | ICD-10-CM

## 2017-03-09 DIAGNOSIS — E279 Disorder of adrenal gland, unspecified: Secondary | ICD-10-CM | POA: Diagnosis present

## 2017-03-09 DIAGNOSIS — K567 Ileus, unspecified: Secondary | ICD-10-CM | POA: Diagnosis not present

## 2017-03-09 DIAGNOSIS — D72829 Elevated white blood cell count, unspecified: Secondary | ICD-10-CM | POA: Diagnosis present

## 2017-03-09 DIAGNOSIS — Z0189 Encounter for other specified special examinations: Secondary | ICD-10-CM

## 2017-03-09 DIAGNOSIS — J9811 Atelectasis: Secondary | ICD-10-CM | POA: Diagnosis not present

## 2017-03-09 DIAGNOSIS — G510 Bell's palsy: Secondary | ICD-10-CM | POA: Diagnosis present

## 2017-03-09 DIAGNOSIS — Z8249 Family history of ischemic heart disease and other diseases of the circulatory system: Secondary | ICD-10-CM

## 2017-03-09 DIAGNOSIS — I10 Essential (primary) hypertension: Secondary | ICD-10-CM | POA: Diagnosis present

## 2017-03-09 DIAGNOSIS — Z6834 Body mass index (BMI) 34.0-34.9, adult: Secondary | ICD-10-CM

## 2017-03-09 DIAGNOSIS — Z79899 Other long term (current) drug therapy: Secondary | ICD-10-CM

## 2017-03-09 DIAGNOSIS — K56609 Unspecified intestinal obstruction, unspecified as to partial versus complete obstruction: Secondary | ICD-10-CM | POA: Diagnosis present

## 2017-03-09 DIAGNOSIS — K430 Incisional hernia with obstruction, without gangrene: Principal | ICD-10-CM | POA: Diagnosis present

## 2017-03-09 DIAGNOSIS — F17210 Nicotine dependence, cigarettes, uncomplicated: Secondary | ICD-10-CM | POA: Diagnosis present

## 2017-03-09 DIAGNOSIS — Z23 Encounter for immunization: Secondary | ICD-10-CM

## 2017-03-09 DIAGNOSIS — R6889 Other general symptoms and signs: Secondary | ICD-10-CM

## 2017-03-09 DIAGNOSIS — R000 Tachycardia, unspecified: Secondary | ICD-10-CM | POA: Diagnosis present

## 2017-03-09 DIAGNOSIS — K429 Umbilical hernia without obstruction or gangrene: Secondary | ICD-10-CM | POA: Diagnosis present

## 2017-03-09 DIAGNOSIS — E669 Obesity, unspecified: Secondary | ICD-10-CM | POA: Diagnosis present

## 2017-03-09 DIAGNOSIS — X58XXXA Exposure to other specified factors, initial encounter: Secondary | ICD-10-CM | POA: Diagnosis present

## 2017-03-09 DIAGNOSIS — T183XXA Foreign body in small intestine, initial encounter: Secondary | ICD-10-CM | POA: Diagnosis present

## 2017-03-09 DIAGNOSIS — K660 Peritoneal adhesions (postprocedural) (postinfection): Secondary | ICD-10-CM | POA: Diagnosis present

## 2017-03-09 DIAGNOSIS — Z90710 Acquired absence of both cervix and uterus: Secondary | ICD-10-CM

## 2017-03-09 HISTORY — PX: LAPAROTOMY: SHX154

## 2017-03-09 HISTORY — DX: Headache, unspecified: R51.9

## 2017-03-09 HISTORY — DX: Headache: R51

## 2017-03-09 LAB — URINALYSIS, ROUTINE W REFLEX MICROSCOPIC
Bacteria, UA: NONE SEEN
Bilirubin Urine: NEGATIVE
Hgb urine dipstick: NEGATIVE
KETONES UR: 80 mg/dL — AB
Leukocytes, UA: NEGATIVE
Nitrite: NEGATIVE
PH: 6 (ref 5.0–8.0)
Protein, ur: NEGATIVE mg/dL

## 2017-03-09 LAB — COMPREHENSIVE METABOLIC PANEL
ALK PHOS: 74 U/L (ref 38–126)
ALT: 17 U/L (ref 14–54)
AST: 13 U/L — AB (ref 15–41)
Albumin: 4 g/dL (ref 3.5–5.0)
Anion gap: 14 (ref 5–15)
BUN: 8 mg/dL (ref 6–20)
CALCIUM: 10.8 mg/dL — AB (ref 8.9–10.3)
CO2: 27 mmol/L (ref 22–32)
CREATININE: 0.77 mg/dL (ref 0.44–1.00)
Chloride: 94 mmol/L — ABNORMAL LOW (ref 101–111)
Glucose, Bld: 336 mg/dL — ABNORMAL HIGH (ref 65–99)
Potassium: 3.9 mmol/L (ref 3.5–5.1)
SODIUM: 135 mmol/L (ref 135–145)
Total Bilirubin: 1.1 mg/dL (ref 0.3–1.2)
Total Protein: 7.8 g/dL (ref 6.5–8.1)

## 2017-03-09 LAB — CBC
HCT: 48.8 % — ABNORMAL HIGH (ref 36.0–46.0)
Hemoglobin: 16.4 g/dL — ABNORMAL HIGH (ref 12.0–15.0)
MCH: 32.2 pg (ref 26.0–34.0)
MCHC: 33.6 g/dL (ref 30.0–36.0)
MCV: 95.9 fL (ref 78.0–100.0)
PLATELETS: 347 10*3/uL (ref 150–400)
RBC: 5.09 MIL/uL (ref 3.87–5.11)
RDW: 13.2 % (ref 11.5–15.5)
WBC: 14.4 10*3/uL — ABNORMAL HIGH (ref 4.0–10.5)

## 2017-03-09 LAB — I-STAT BETA HCG BLOOD, ED (MC, WL, AP ONLY): I-stat hCG, quantitative: <5 m[IU]/mL (ref <5)

## 2017-03-09 LAB — LIPASE, BLOOD: Lipase: 25 U/L (ref 11–51)

## 2017-03-09 SURGERY — LAPAROTOMY, EXPLORATORY
Anesthesia: General | Site: Abdomen

## 2017-03-09 MED ORDER — CEFOTETAN DISODIUM 2 G IJ SOLR
2.0000 g | Freq: Once | INTRAMUSCULAR | Status: AC
  Administered 2017-03-09: 2 g via INTRAVENOUS
  Filled 2017-03-09: qty 2

## 2017-03-09 MED ORDER — FENTANYL CITRATE (PF) 250 MCG/5ML IJ SOLN
INTRAMUSCULAR | Status: AC
  Filled 2017-03-09: qty 5

## 2017-03-09 MED ORDER — ESMOLOL HCL 100 MG/10ML IV SOLN
INTRAVENOUS | Status: DC | PRN
  Administered 2017-03-09 (×2): 30 mg via INTRAVENOUS

## 2017-03-09 MED ORDER — HYDROMORPHONE HCL 1 MG/ML IJ SOLN
0.2500 mg | INTRAMUSCULAR | Status: DC | PRN
  Administered 2017-03-10 (×3): 0.5 mg via INTRAVENOUS

## 2017-03-09 MED ORDER — MIDAZOLAM HCL 2 MG/2ML IJ SOLN
INTRAMUSCULAR | Status: AC
  Filled 2017-03-09: qty 2

## 2017-03-09 MED ORDER — PROMETHAZINE HCL 25 MG/ML IJ SOLN: 6.2500 mg | INTRAMUSCULAR | Status: DC | PRN

## 2017-03-09 MED ORDER — HYDROMORPHONE HCL 1 MG/ML IJ SOLN
0.5000 mg | Freq: Once | INTRAMUSCULAR | Status: AC
  Administered 2017-03-09: 0.5 mg via INTRAVENOUS
  Filled 2017-03-09: qty 1

## 2017-03-09 MED ORDER — LACTATED RINGERS IV SOLN
INTRAVENOUS | Status: DC | PRN
  Administered 2017-03-09 – 2017-03-10 (×3): via INTRAVENOUS

## 2017-03-09 MED ORDER — ONDANSETRON 4 MG PO TBDP
4.0000 mg | ORAL_TABLET | Freq: Once | ORAL | Status: AC
  Administered 2017-03-09: 4 mg via ORAL
  Filled 2017-03-09: qty 1

## 2017-03-09 MED ORDER — PROPOFOL 10 MG/ML IV BOLUS
INTRAVENOUS | Status: AC
  Filled 2017-03-09: qty 20

## 2017-03-09 MED ORDER — IOPAMIDOL (ISOVUE-300) INJECTION 61%
INTRAVENOUS | Status: AC
  Administered 2017-03-09: 100 mL
  Filled 2017-03-09: qty 100

## 2017-03-09 MED ORDER — SODIUM CHLORIDE 0.9 % IV BOLUS (SEPSIS)
1000.0000 mL | Freq: Once | INTRAVENOUS | Status: AC
  Administered 2017-03-09: 1000 mL via INTRAVENOUS

## 2017-03-09 MED ORDER — HYDROMORPHONE HCL 1 MG/ML IJ SOLN
INTRAMUSCULAR | Status: AC
  Filled 2017-03-09: qty 1

## 2017-03-09 MED ORDER — ALBUMIN HUMAN 5 % IV SOLN
INTRAVENOUS | Status: DC | PRN
  Administered 2017-03-09: via INTRAVENOUS

## 2017-03-09 SURGICAL SUPPLY — 42 items
CHLORAPREP W/TINT 26ML (MISCELLANEOUS) ×2 IMPLANT
CLIP VESOCCLUDE LG 6/CT (CLIP) ×2 IMPLANT
CLIP VESOCCLUDE MED 6/CT (CLIP) ×2 IMPLANT
CLIP VESOCCLUDE SM WIDE 6/CT (CLIP) ×2 IMPLANT
COVER SURGICAL LIGHT HANDLE (MISCELLANEOUS) ×2 IMPLANT
DRAPE LAPAROSCOPIC ABDOMINAL (DRAPES) ×2 IMPLANT
DRAPE WARM FLUID 44X44 (DRAPE) ×2 IMPLANT
DRSG OPSITE POSTOP 4X10 (GAUZE/BANDAGES/DRESSINGS) IMPLANT
DRSG OPSITE POSTOP 4X8 (GAUZE/BANDAGES/DRESSINGS) ×2 IMPLANT
ELECT BLADE 6.5 EXT (BLADE) IMPLANT
ELECT CAUTERY BLADE 6.4 (BLADE) ×4 IMPLANT
ELECT REM PT RETURN 9FT ADLT (ELECTROSURGICAL) ×2
ELECTRODE REM PT RTRN 9FT ADLT (ELECTROSURGICAL) ×1 IMPLANT
GLOVE BIOGEL PI IND STRL 7.0 (GLOVE) ×3 IMPLANT
GLOVE BIOGEL PI INDICATOR 7.0 (GLOVE) ×3
GLOVE SURG SS PI 7.0 STRL IVOR (GLOVE) ×6 IMPLANT
GOWN STRL REUS W/ TWL LRG LVL3 (GOWN DISPOSABLE) ×2 IMPLANT
GOWN STRL REUS W/TWL LRG LVL3 (GOWN DISPOSABLE) ×2
KIT BASIN OR (CUSTOM PROCEDURE TRAY) ×2 IMPLANT
LIGASURE IMPACT 36 18CM CVD LR (INSTRUMENTS) IMPLANT
LOOP VESSEL MAXI BLUE (MISCELLANEOUS) IMPLANT
LOOP VESSEL MINI RED (MISCELLANEOUS) IMPLANT
PACK GENERAL/GYN (CUSTOM PROCEDURE TRAY) ×2 IMPLANT
RELOAD PROXIMATE 75MM BLUE (ENDOMECHANICALS) ×2 IMPLANT
SPONGE LAP 18X18 X RAY DECT (DISPOSABLE) ×2 IMPLANT
STAPLER PROXIMATE 75MM BLUE (STAPLE) ×2 IMPLANT
STAPLER VISISTAT 35W (STAPLE) ×2 IMPLANT
SUCTION POOLE TIP (SUCTIONS) ×2 IMPLANT
SUT PDS AB 0 CT 36 (SUTURE) ×4 IMPLANT
SUT PDS AB 1 TP1 96 (SUTURE) IMPLANT
SUT SILK 2 0 (SUTURE) ×1
SUT SILK 2 0 SH CR/8 (SUTURE) ×4 IMPLANT
SUT SILK 2 0 TIES 10X30 (SUTURE) ×2 IMPLANT
SUT SILK 2-0 18XBRD TIE 12 (SUTURE) ×1 IMPLANT
SUT SILK 3 0 (SUTURE) ×1
SUT SILK 3 0 SH CR/8 (SUTURE) ×6 IMPLANT
SUT SILK 3 0 TIES 10X30 (SUTURE) ×2 IMPLANT
SUT SILK 3-0 18XBRD TIE 12 (SUTURE) ×1 IMPLANT
TOWEL OR 17X26 10 PK STRL BLUE (TOWEL DISPOSABLE) ×2 IMPLANT
TRAY FOLEY CATH SILVER 14FR (SET/KITS/TRAYS/PACK) ×2 IMPLANT
TRAY FOLEY W/METER SILVER 16FR (SET/KITS/TRAYS/PACK) IMPLANT
YANKAUER SUCT BULB TIP NO VENT (SUCTIONS) ×2 IMPLANT

## 2017-03-09 NOTE — ED Notes (Signed)
IV fluid bolus put on IV pump d/t patient continuing to move arm which stops flow of bolus. Bolus flowing at this time.

## 2017-03-09 NOTE — ED Notes (Signed)
When administering dilaudid, plunger came out from syringe due to saline infusion/bolus and medication leaked onto the floor. Wasted the documented given amount in the pyxis with second RN and pulled out a second dosage via "override" to administer.

## 2017-03-09 NOTE — ED Provider Notes (Signed)
[facility] Provider Note   CSN: [id number] Arrival date & time: 03/09/17  1349     History   Chief Complaint Chief Complaint  Patient presents with  . Fever  . Chills  . Emesis    HPI Kristi Russell is a 56 y.o. female.  HPI 56 year old female with a history of hysterectomy presented to the ED with generalized abdominal pain, nausea, vomiting that began yesterday.  Patient describes pain as cramping, that is intermittent, and is rated at "20"/10.  She has never had these symptoms before.  She also reports right flank pain, chills, subjective fever, cough, and shortness of breath with the episodes of pain. denies any dysuria, hematuria, frequency, vaginal bleeding, vaginal discharge.  She denies any diarrhea, constipation. her last bowel movement was yesterday and was normal.  Denies any blood in stool.   Denies persistent shortness of breath or chest pain.    Past Medical History:  Diagnosis Date  . 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 Hypertension 1990s  . 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 Truman Hayward on [place].  Could not afford to go back for treatment.    Patient Active Problem List   Diagnosis Date Noted  . 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 Retinal detachment 01/12/2016  . Hypertension     Past Surgical History:  Procedure Laterality Date  . ABDOMINAL HYSTERECTOMY  2009   She thinks TAH/BSO:  [place], [place].  Dr. Lacinda Axon    OB History    Gravida Para Term Preterm AB Living   6       3     SAB TAB Ectopic Multiple Live Births   1 2     3        Home Medications    Prior to Admission medications   Medication Sig Start Date End Date Taking? Authorizing Provider  cetirizine (ZYRTEC) 10 MG tablet Take 1 tablet (10 mg total) by mouth daily. 02/24/17  Yes Mack Hook, MD  losartan-hydrochlorothiazide [facility])  100-25 MG tablet 1 tab by mouth daily 01/14/17  Yes Mack Hook, MD  artificial tears (LACRILUBE) OINT ophthalmic ointment Place ribbon in left eye and patch closed before bedtime daily Patient not taking: Reported on 03/09/2017 02/24/17   Mack Hook, MD  ibuprofen (ADVIL,MOTRIN) 600 MG tablet Take 1 tablet (600 mg total) by mouth every 6 (six) hours as needed. Patient not taking: Reported on 02/24/2017 06/11/15   Comer Locket, PA-C  mometasone (NASONEX) 50 MCG/ACT nasal spray 2 sprays each nostril daily Patient not taking: Reported on 03/09/2017 02/24/17   Mack Hook, MD  varenicline (CHANTIX CONTINUING MONTH PAK) 1 MG tablet Take 1 tablet (1 mg total) by mouth 2 (two) times daily. 02/24/17   Mack Hook, MD  varenicline (CHANTIX STARTING MONTH PAK) 0.5 MG X 11 & 1 MG X 42 tablet Take one 0.5 mg tablet by mouth once daily for 3 days, then increase to one 0.5 mg tablet twice daily for 4 days, then increase to one 1 mg tablet twice daily. 02/24/17   Mack Hook, MD    Family History Family History  Problem Relation Age of Onset  . Heart disease Mother   . Hypertension Mother   . Stroke Mother        Sudden cardiac arrest vs. stroke  . Diabetes Sister   . Hypertension Sister   .  Kidney disease Brother        on Dialysis  . Hypertension Daughter   . Obstructive Sleep Apnea Brother   . Breast cancer Maternal Grandmother     Social History Social History   Tobacco Use  . Smoking status: Current Every Day Smoker    Packs/day: 0.50    Years: 38.00    Pack years: 19.00    Types: Cigarettes  . Smokeless tobacco: Never Used  Substance Use Topics  . Alcohol use: Yes    Comment: 3 40 ounce malt liquor per week.  . Drug use: No     Allergies   Patient has no known allergies.   Review of Systems Review of Systems  Constitutional: Positive for chills and fever.  HENT: Negative for ear pain and sore throat.   Eyes: Negative for pain and  visual disturbance.  Respiratory: Negative for cough and shortness of breath.   Cardiovascular: Negative for chest pain and palpitations.  Gastrointestinal: Positive for abdominal pain, nausea and vomiting. Negative for blood in stool, constipation and diarrhea.  Endocrine: Positive for polydipsia (chronic ) and polyuria (chronic).  Genitourinary: Positive for flank pain. Negative for dysuria, frequency, hematuria, urgency, vaginal bleeding, vaginal discharge and vaginal pain.  Musculoskeletal: Negative for arthralgias and back pain.  Skin: Negative for color change and rash.  Neurological: Negative for seizures and syncope.  All other systems reviewed and are negative.    Physical Exam Updated Vital Signs BP 139/89   Pulse (!) 109   Temp 98.3 F (36.8 C) (Oral)   Resp 18   Ht 5\' 3"  (1.6 m)   Wt 88 kg (194 lb)   SpO2 93%   BMI 34.37 kg/m   Physical Exam  Constitutional: She appears well-developed and well-nourished. She appears distressed.  HENT:  Head: Normocephalic and atraumatic.  Eyes: Conjunctivae and EOM are normal.  Exophthalmus bilaterally  Neck: Normal range of motion. Neck supple.  Cardiovascular: Normal rate, regular rhythm, normal heart sounds and intact distal pulses.  No murmur heard. Pulmonary/Chest: Effort normal and breath sounds normal. No respiratory distress. She has no wheezes.  Cough on exam  Abdominal: There is tenderness (generalized).  Decreased BS. Pt guarding. Some distension. Abdomen somewhat firm on exam  Musculoskeletal: She exhibits no edema.  Neurological: She is alert.  Skin: Skin is warm and dry.  Psychiatric: She has a normal mood and affect.  Nursing note and vitals reviewed.    ED Treatments / Results  Labs (all labs ordered are listed, but only abnormal results are displayed) Labs Reviewed  COMPREHENSIVE METABOLIC PANEL - Abnormal; Notable for the following components:      Result Value   Chloride 94 (*)    Glucose, Bld 336  (*)    Calcium 10.8 (*)    AST 13 (*)    All other components within normal limits  CBC - Abnormal; Notable for the following components:   WBC 14.4 (*)    Hemoglobin 16.4 (*)    HCT 48.8 (*)    All other components within normal limits  URINALYSIS, ROUTINE W REFLEX MICROSCOPIC - Abnormal; Notable for the following components:   Specific Gravity, Urine >1.046 (*)    Glucose, UA >=500 (*)    Ketones, ur 80 (*)    Squamous Epithelial / LPF 0-5 (*)    All other components within normal limits  LIPASE, BLOOD  I-STAT BETA HCG BLOOD, ED ([facility], [facility], [facility] ONLY)    EKG  EKG Interpretation None  Radiology Ct Abdomen Pelvis W Contrast  Result Date: 03/09/2017 CLINICAL DATA:  Abdominal pain and nausea with vomiting beginning yesterday. EXAM: CT ABDOMEN AND PELVIS WITH CONTRAST TECHNIQUE: Multidetector CT imaging of the abdomen and pelvis was performed using the standard protocol following bolus administration of intravenous contrast. CONTRAST:  100 mL ISOVUE-300 IOPAMIDOL (ISOVUE-300) INJECTION 61% COMPARISON:  None. FINDINGS: Lower Chest: No acute findings. Hepatobiliary: No hepatic masses identified. Mild diffuse hepatic steatosis. Gallbladder is unremarkable. Pancreas:  No mass or inflammatory changes. Spleen: Within normal limits in size and appearance. Adrenals/Urinary Tract: Homogeneous left adrenal mass is seen measuring 1.4 cm, which is nonspecific characteristics by CT. Both kidneys are normal in appearance. Unremarkable unopacified urinary bladder. Stomach/Bowel: Moderate dilatation of mid and distal small bowel loops the pelvis which contain fecalized material. Transition point is seen at site of surgical anastomosis in the right lower quadrant, consistent with small-bowel obstruction. Distal ileum and colon are nondilated. There is a paraumbilical ventral hernia containing a dilated small bowel loop which shows mild wall thickening, suspicious for ischemia of herniated bowel loop. Small  amount of free fluid seen in the pelvis, however there is no evidence of pneumatosis or free intraperitoneal air. Normal appendix visualized. Vascular/Lymphatic: No pathologically enlarged lymph nodes. No abdominal aortic aneurysm. Reproductive: Prior hysterectomy noted. Adnexal regions are unremarkable in appearance. Other:  None. Musculoskeletal:  No suspicious bone lesions identified. IMPRESSION: Distal small bowel obstruction, with transition point at site of surgical anastomosis and right lower quadrant. Paraumbilical hernia containing a dilated small bowel loop, which shows mild wall thickening suspicious for ischemia. Small amount of free fluid in pelvis. 1.4 cm indeterminate left adrenal mass. Recommend further nonemergent evaluation with adrenal protocol abdomen CT or MRI without and with contrast. This recommendation follows ACR consensus guidelines: Management of Incidental Adrenal Masses: A White Paper of the ACR Incidental Findings Committee. J Am Coll Radiol 2017;14:1038-1044. Electronically Signed   By: Earle Gell M.D.   On: 03/09/2017 19:48   Dg Chest Port 1 View  Result Date: 03/09/2017 CLINICAL DATA:  Shortness breath and chest pressure since yesterday EXAM: PORTABLE CHEST 1 VIEW COMPARISON:  Aug 02, 2013 FINDINGS: The heart size and mediastinal contours are within normal limits. There is no focal infiltrate, pulmonary edema, or pleural effusion. The visualized skeletal structures are unremarkable. IMPRESSION: No active disease. Electronically Signed   By: Abelardo Diesel M.D.   On: 03/09/2017 18:19    Procedures Procedures (including critical care time)  Medications Ordered in ED Medications  promethazine (PHENERGAN) injection 6.25-12.5 mg (not administered)  HYDROmorphone (DILAUDID) injection 0.25-0.5 mg (not administered)  0.9 % irrigation (POUR BTL) (1,000 mLs Irrigation Given 03/10/17 0058)  ondansetron (ZOFRAN-ODT) disintegrating tablet 4 mg (4 mg Oral Given 03/09/17 1748)    sodium chloride 0.9 % bolus 1,000 mL (0 mLs Intravenous Stopped 03/09/17 2110)  HYDROmorphone (DILAUDID) injection 0.5 mg (0.5 mg Intravenous Given 03/09/17 1748)  iopamidol (ISOVUE-300) 61 % injection (100 mLs  Contrast Given 03/09/17 1919)  HYDROmorphone (DILAUDID) injection 0.5 mg (0.5 mg Intravenous Given 03/09/17 2029)  cefoTEtan (CEFOTAN) 2 g in dextrose 5 % 50 mL IVPB (2 g Intravenous New Bag/Given 03/09/17 2351)     Initial Impression / Assessment and Plan / ED Course  I have reviewed the triage vital signs and the nursing notes.  Pertinent labs & imaging results that were available during my care of the patient were reviewed by me and considered in my medical decision making (see chart for details).  Staffed pt with Dr. Sabra Heck. Discussed plan.   Dr. Sabra Heck evaluated pt and was able to reduce pts hernia.   Dr. Sabra Heck consulted general surgery about pt's CT scan. Gen surg will see the pt when physician is out of surgery.   Rechecked patient.  She is still having abdominal pain after receiving Dilaudid.  She has had no episodes of vomiting.  She is not currently complaining of nausea.  I discussed her lab and imaging results and informed her of the the plan for admission to the hospital.  She understands the plan and agrees.  Final Clinical Impressions(s) / ED Diagnoses   Final diagnoses:  Small bowel obstruction ([place])  Umbilical hernia without obstruction and without gangrene    56 year old female with a history of hysterectomy presented to the ED with generalized abdominal pain, nausea, vomiting that began yesterday.  Labs significant for elevated BS, but no anion gap or concern for DKA.  WBC and HCT elevated. UA with glucosuria, but no evidence of infection. CT abdomen showed distal small bowel obstruction and periumbilical hernia containing dilated small bowel loop which shows mild thickening and suspicious for ischemia.  A small amount of free fluid in the pelvis and an  indeterminate adrenal mass.  General surgery consulted for bowel obstruction and agreed to consult on patient.  Patient later admitted to surgery service for exploratory laparotomy.   ED Discharge Orders    None       Bishop Dublin 03/10/17 0108    Noemi Chapel, MD 03/10/17 [phone]

## 2017-03-09 NOTE — ED Provider Notes (Signed)
The patient is a 56 year old female, she has a prior history of a hysterectomy, she has no other abdominal surgery or significant abdominal history but presents after developing symptoms starting yesterday morning when she awoke with abdominal pain and cramping which seems to be fluctuating in intensity but rather constant, gradually worsening and is now become severe.  She has been nauseated and vomiting all day but has not had any diarrhea.  She also feels as though she is having some fevers and chills but has not taken her temperature.  On exam the patient is borderline tachycardic, she has an abdomen which is extremely tender in the lower abdomen but not so much in the upper abdomen, there is guarding in the lower abdomen but no peritoneal signs.  She has clear lung sounds, speaks in full sentences and has no peripheral edema.  The patient will need to have a further workup with imaging including a CT scan to make sure she has not had perforation appendicitis or diverticulitis with complication.  Pain medication nausea medication IV fluids.  I discussed care with the surgeon through the charge nurse in the operating room.  He will come to see the patient when he is out of surgery.  I have given the patient additional dose of hydromorphone secondary to ongoing pain.  I have been able to reduce the hernia that is seen on the CT scan, the hernia does come back, I do not think that this is a transition point, the CT scan does show some ischemic bowel which raises significant concern especially with her leukocytosis and ongoing tachycardia.  She is getting ongoing IV fluids   Medical screening examination/treatment/procedure(s) were conducted as a shared visit with non-physician practitioner(s) and myself.  I personally evaluated the patient during the encounter.  Clinical Impression:   Final diagnoses:  Small bowel obstruction ([place])  Umbilical hernia without obstruction and without gangrene          Noemi Chapel, MD 03/10/17 [phone]

## 2017-03-09 NOTE — ED Notes (Signed)
Pt notified about needing urine sample

## 2017-03-09 NOTE — Anesthesia Preprocedure Evaluation (Signed)
Anesthesia Evaluation  Patient identified by MRN, date of birth, ID band Patient awake    Reviewed: Allergy & Precautions, NPO status , Patient's Chart, lab work & pertinent test results  Airway Mallampati: II  TM Distance: >3 FB Neck ROM: Full    Dental no notable dental hx.    Pulmonary Current Smoker,    Pulmonary exam normal breath sounds clear to auscultation       Cardiovascular hypertension, Normal cardiovascular exam Rhythm:Regular Rate:Normal     Neuro/Psych negative neurological ROS  negative psych ROS   GI/Hepatic negative GI ROS, Neg liver ROS,   Endo/Other  negative endocrine ROS  Renal/GU negative Renal ROS  negative genitourinary   Musculoskeletal negative musculoskeletal ROS (+)   Abdominal   Peds negative pediatric ROS (+)  Hematology negative hematology ROS (+)   Anesthesia Other Findings   Reproductive/Obstetrics negative OB ROS                             Anesthesia Physical Anesthesia Plan  ASA: II and emergent  Anesthesia Plan: General   Post-op Pain Management:    Induction: Intravenous  PONV Risk Score and Plan: 2 and Ondansetron, Dexamethasone and Treatment may vary due to age or medical condition  Airway Management Planned: Oral ETT  Additional Equipment:   Intra-op Plan:   Post-operative Plan: Extubation in OR  Informed Consent: I have reviewed the patients History and Physical, chart, labs and discussed the procedure including the risks, benefits and alternatives for the proposed anesthesia with the patient or authorized representative who has indicated his/her understanding and acceptance.   Dental advisory given  Plan Discussed with: CRNA and Surgeon  Anesthesia Plan Comments:         Anesthesia Quick Evaluation

## 2017-03-09 NOTE — ED Triage Notes (Signed)
Pt. Stated, I got sick yesterday morning with stomach pain and N/V. Im cold and hot and feel so bad. I ate some [place] food and it started after that.

## 2017-03-09 NOTE — ED Notes (Signed)
Patient transported to CT via stretcher.

## 2017-03-09 NOTE — H&P (Signed)
Kristi Russell is an 56 y.o. female.   Chief Complaint: abdominal pain HPI: 56 yo female presents with 1 day of abdominal pain and vomiting. The pain is constant it is not related to food. It hurts worse to move. She had a normal bowel movement yesterday but usually runs loose. She had a hysterectomy and a complication during a c section that involved her intestines in the past.  Past Medical History:  Diagnosis Date  . 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 Hypertension 1990s  . 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 Truman Hayward on [place].  Could not afford to go back for treatment.    Past Surgical History:  Procedure Laterality Date  . ABDOMINAL HYSTERECTOMY  2009   She thinks TAH/BSO:  [place], [place].  Dr. Lacinda Axon    Family History  Problem Relation Age of Onset  . Heart disease Mother   . Hypertension Mother   . Stroke Mother        Sudden cardiac arrest vs. stroke  . Diabetes Sister   . Hypertension Sister   . Kidney disease Brother        on Dialysis  . Hypertension Daughter   . Obstructive Sleep Apnea Brother   . Breast cancer Maternal Grandmother    Social History:  reports that she has been smoking cigarettes.  She has a 19.00 pack-year smoking history. she has never used smokeless tobacco. She reports that she drinks alcohol. She reports that she does not use drugs.  Allergies: No Known Allergies   (Not in a hospital admission)  Results for orders placed or performed during the hospital encounter of 03/09/17 (from the past 48 hour(s))  Lipase, blood     Status: None   Collection Time: 03/09/17  2:36 PM  Result Value Ref Range   Lipase 25 11 - 51 U/L  Comprehensive metabolic panel     Status: Abnormal   Collection Time: 03/09/17  2:36 PM  Result Value Ref Range   Sodium 135 135 - 145 mmol/L   Potassium 3.9 3.5 - 5.1 mmol/L   Chloride 94 (L) 101 - 111  mmol/L   CO2 27 22 - 32 mmol/L   Glucose, Bld 336 (H) 65 - 99 mg/dL   BUN 8 6 - 20 mg/dL   Creatinine, Ser 0.77 0.44 - 1.00 mg/dL   Calcium 10.8 (H) 8.9 - 10.3 mg/dL   Total Protein 7.8 6.5 - 8.1 g/dL   Albumin 4.0 3.5 - 5.0 g/dL   AST 13 (L) 15 - 41 U/L   ALT 17 14 - 54 U/L   Alkaline Phosphatase 74 38 - 126 U/L   Total Bilirubin 1.1 0.3 - 1.2 mg/dL   GFR calc non Af Amer >60 >60 mL/min   GFR calc Af Amer >60 >60 mL/min    Comment: (NOTE) The eGFR has been calculated using the CKD EPI equation. This calculation has not been validated in all clinical situations. eGFR's persistently <60 mL/min signify possible Chronic Kidney Disease.    Anion gap 14 5 - 15  CBC     Status: Abnormal   Collection Time: 03/09/17  2:36 PM  Result Value Ref Range   WBC 14.4 (H) 4.0 - 10.5 K/uL   RBC 5.09 3.87 - 5.11 MIL/uL   Hemoglobin 16.4 (H) 12.0 - 15.0 g/dL   HCT 48.8 (H) 36.0 - 46.0 %  MCV 95.9 78.0 - 100.0 fL   MCH 32.2 26.0 - 34.0 pg   MCHC 33.6 30.0 - 36.0 g/dL   RDW 13.2 11.5 - 15.5 %   Platelets 347 150 - 400 K/uL  I-Stat beta hCG blood, ED     Status: None   Collection Time: 03/09/17  2:58 PM  Result Value Ref Range   I-stat hCG, quantitative <5.0 <5 mIU/mL   Comment 3            Comment:   GEST. AGE      CONC.  (mIU/mL)   <=1 WEEK        5 - 50     2 WEEKS       50 - 500     3 WEEKS       100 - 10,000     4 WEEKS     1,000 - 30,000        FEMALE AND NON-PREGNANT FEMALE:     LESS THAN 5 mIU/mL   Urinalysis, Routine w reflex microscopic     Status: Abnormal   Collection Time: 03/09/17  9:05 PM  Result Value Ref Range   Color, Urine YELLOW YELLOW   APPearance CLEAR CLEAR   Specific Gravity, Urine >1.046 (H) 1.005 - 1.030   pH 6.0 5.0 - 8.0   Glucose, UA >=500 (A) NEGATIVE mg/dL   Hgb urine dipstick NEGATIVE NEGATIVE   Bilirubin Urine NEGATIVE NEGATIVE   Ketones, ur 80 (A) NEGATIVE mg/dL   Protein, ur NEGATIVE NEGATIVE mg/dL   Nitrite NEGATIVE NEGATIVE   Leukocytes, UA  NEGATIVE NEGATIVE   RBC / HPF 0-5 0 - 5 RBC/hpf   WBC, UA 0-5 0 - 5 WBC/hpf   Bacteria, UA NONE SEEN NONE SEEN   Squamous Epithelial / LPF 0-5 (A) NONE SEEN   Mucus PRESENT    Ct Abdomen Pelvis W Contrast  Result Date: 03/09/2017 CLINICAL DATA:  Abdominal pain and nausea with vomiting beginning yesterday. EXAM: CT ABDOMEN AND PELVIS WITH CONTRAST TECHNIQUE: Multidetector CT imaging of the abdomen and pelvis was performed using the standard protocol following bolus administration of intravenous contrast. CONTRAST:  100 mL ISOVUE-300 IOPAMIDOL (ISOVUE-300) INJECTION 61% COMPARISON:  None. FINDINGS: Lower Chest: No acute findings. Hepatobiliary: No hepatic masses identified. Mild diffuse hepatic steatosis. Gallbladder is unremarkable. Pancreas:  No mass or inflammatory changes. Spleen: Within normal limits in size and appearance. Adrenals/Urinary Tract: Homogeneous left adrenal mass is seen measuring 1.4 cm, which is nonspecific characteristics by CT. Both kidneys are normal in appearance. Unremarkable unopacified urinary bladder. Stomach/Bowel: Moderate dilatation of mid and distal small bowel loops the pelvis which contain fecalized material. Transition point is seen at site of surgical anastomosis in the right lower quadrant, consistent with small-bowel obstruction. Distal ileum and colon are nondilated. There is a paraumbilical ventral hernia containing a dilated small bowel loop which shows mild wall thickening, suspicious for ischemia of herniated bowel loop. Small amount of free fluid seen in the pelvis, however there is no evidence of pneumatosis or free intraperitoneal air. Normal appendix visualized. Vascular/Lymphatic: No pathologically enlarged lymph nodes. No abdominal aortic aneurysm. Reproductive: Prior hysterectomy noted. Adnexal regions are unremarkable in appearance. Other:  None. Musculoskeletal:  No suspicious bone lesions identified. IMPRESSION: Distal small bowel obstruction, with  transition point at site of surgical anastomosis and right lower quadrant. Paraumbilical hernia containing a dilated small bowel loop, which shows mild wall thickening suspicious for ischemia. Small amount of free fluid in pelvis. 1.4  cm indeterminate left adrenal mass. Recommend further nonemergent evaluation with adrenal protocol abdomen CT or MRI without and with contrast. This recommendation follows ACR consensus guidelines: Management of Incidental Adrenal Masses: A White Paper of the ACR Incidental Findings Committee. J Am Coll Radiol 2017;14:1038-1044. Electronically Signed   By: Earle Gell M.D.   On: 03/09/2017 19:48   Dg Chest Port 1 View  Result Date: 03/09/2017 CLINICAL DATA:  Shortness breath and chest pressure since yesterday EXAM: PORTABLE CHEST 1 VIEW COMPARISON:  Aug 02, 2013 FINDINGS: The heart size and mediastinal contours are within normal limits. There is no focal infiltrate, pulmonary edema, or pleural effusion. The visualized skeletal structures are unremarkable. IMPRESSION: No active disease. Electronically Signed   By: Abelardo Diesel M.D.   On: 03/09/2017 18:19    ROS  Blood pressure 139/89, pulse (!) 109, temperature 98.3 F (36.8 C), temperature source Oral, resp. rate 18, height '5\' 3"'  (1.6 m), weight 88 kg (194 lb), SpO2 93 %. Physical Exam   Assessment/Plan 56 yo female with abdominal pain, leukocytosis, tachycardia and CT findings concerning for strangulated intestines in an incisional hernia and possible bowel obstruction. -OR for exploration, possible bowel resection, possible hernia repair -IV abx -NPO  Mickeal Skinner, MD 03/09/2017, 10:48 PM

## 2017-03-10 ENCOUNTER — Inpatient Hospital Stay (HOSPITAL_COMMUNITY): Payer: Self-pay

## 2017-03-10 ENCOUNTER — Other Ambulatory Visit: Payer: Self-pay

## 2017-03-10 ENCOUNTER — Encounter (HOSPITAL_COMMUNITY): Payer: Self-pay | Admitting: General Surgery

## 2017-03-10 DIAGNOSIS — K56609 Unspecified intestinal obstruction, unspecified as to partial versus complete obstruction: Secondary | ICD-10-CM | POA: Diagnosis present

## 2017-03-10 LAB — CBC
HCT: 42 % (ref 36.0–46.0)
Hemoglobin: 13.7 g/dL (ref 12.0–15.0)
MCH: 31.6 pg (ref 26.0–34.0)
MCHC: 32.6 g/dL (ref 30.0–36.0)
MCV: 96.8 fL (ref 78.0–100.0)
PLATELETS: 273 10*3/uL (ref 150–400)
RBC: 4.34 MIL/uL (ref 3.87–5.11)
RDW: 13.1 % (ref 11.5–15.5)
WBC: 6.5 10*3/uL (ref 4.0–10.5)

## 2017-03-10 LAB — BASIC METABOLIC PANEL
ANION GAP: 9 (ref 5–15)
BUN: 9 mg/dL (ref 6–20)
CO2: 25 mmol/L (ref 22–32)
Calcium: 8.8 mg/dL — ABNORMAL LOW (ref 8.9–10.3)
Chloride: 101 mmol/L (ref 101–111)
Creatinine, Ser: 0.58 mg/dL (ref 0.44–1.00)
GFR calc Af Amer: >60 mL/min (ref >60)
GLUCOSE: 287 mg/dL — AB (ref 65–99)
POTASSIUM: 3.4 mmol/L — AB (ref 3.5–5.1)
SODIUM: 135 mmol/L (ref 135–145)

## 2017-03-10 LAB — MAGNESIUM: MAGNESIUM: 1.5 mg/dL — AB (ref 1.7–2.4)

## 2017-03-10 LAB — PHOSPHORUS: Phosphorus: 2.8 mg/dL (ref 2.5–4.6)

## 2017-03-10 LAB — GLUCOSE, CAPILLARY: Glucose-Capillary: 265 mg/dL — ABNORMAL HIGH (ref 65–99)

## 2017-03-10 MED ORDER — ENOXAPARIN SODIUM 40 MG/0.4ML ~~LOC~~ SOLN
40.0000 mg | SUBCUTANEOUS | Status: DC
  Administered 2017-03-11 – 2017-03-20 (×10): 40 mg via SUBCUTANEOUS
  Filled 2017-03-10 (×10): qty 0.4

## 2017-03-10 MED ORDER — OXYCODONE HCL 5 MG PO TABS
5.0000 mg | ORAL_TABLET | ORAL | Status: DC | PRN
  Administered 2017-03-12 – 2017-03-14 (×7): 10 mg via ORAL
  Filled 2017-03-10 (×10): qty 2

## 2017-03-10 MED ORDER — FLUTICASONE PROPIONATE 50 MCG/ACT NA SUSP
1.0000 | Freq: Every day | NASAL | Status: DC
  Administered 2017-03-12 – 2017-03-20 (×9): 1 via NASAL
  Filled 2017-03-10: qty 16

## 2017-03-10 MED ORDER — PROPOFOL 10 MG/ML IV BOLUS
INTRAVENOUS | Status: DC | PRN
  Administered 2017-03-09: 150 mg via INTRAVENOUS

## 2017-03-10 MED ORDER — ROCURONIUM BROMIDE 100 MG/10ML IV SOLN
INTRAVENOUS | Status: DC | PRN
  Administered 2017-03-09: 40 mg via INTRAVENOUS

## 2017-03-10 MED ORDER — INSULIN ASPART 100 UNIT/ML ~~LOC~~ SOLN
SUBCUTANEOUS | Status: AC
Start: 2017-03-10 — End: 2017-03-10
  Filled 2017-03-10: qty 1

## 2017-03-10 MED ORDER — FENTANYL CITRATE (PF) 100 MCG/2ML IJ SOLN
INTRAMUSCULAR | Status: DC | PRN
  Administered 2017-03-09: 100 ug via INTRAVENOUS
  Administered 2017-03-10: 50 ug via INTRAVENOUS
  Administered 2017-03-10 (×2): 100 ug via INTRAVENOUS
  Administered 2017-03-10: 50 ug via INTRAVENOUS

## 2017-03-10 MED ORDER — ACETAMINOPHEN 500 MG PO TABS
1000.0000 mg | ORAL_TABLET | Freq: Four times a day (QID) | ORAL | Status: DC
  Administered 2017-03-10 (×2): 1000 mg via ORAL
  Filled 2017-03-10 (×2): qty 2

## 2017-03-10 MED ORDER — LIDOCAINE HCL (CARDIAC) 20 MG/ML IV SOLN
INTRAVENOUS | Status: DC | PRN
  Administered 2017-03-09: 60 mg via INTRAVENOUS

## 2017-03-10 MED ORDER — PIPERACILLIN-TAZOBACTAM 3.375 G IVPB
3.3750 g | Freq: Three times a day (TID) | INTRAVENOUS | Status: DC
  Administered 2017-03-10 – 2017-03-11 (×2): 3.375 g via INTRAVENOUS
  Filled 2017-03-10 (×3): qty 50

## 2017-03-10 MED ORDER — MORPHINE SULFATE (PF) 4 MG/ML IV SOLN
2.0000 mg | INTRAVENOUS | Status: DC | PRN
  Administered 2017-03-10 – 2017-03-12 (×16): 2 mg via INTRAVENOUS
  Filled 2017-03-10 (×16): qty 1

## 2017-03-10 MED ORDER — 0.9 % SODIUM CHLORIDE (POUR BTL) OPTIME
TOPICAL | Status: DC | PRN
  Administered 2017-03-10: 1000 mL

## 2017-03-10 MED ORDER — ONDANSETRON HCL 4 MG/2ML IJ SOLN
4.0000 mg | Freq: Four times a day (QID) | INTRAMUSCULAR | Status: DC | PRN
  Administered 2017-03-14 – 2017-03-16 (×6): 4 mg via INTRAVENOUS
  Filled 2017-03-10 (×7): qty 2

## 2017-03-10 MED ORDER — INFLUENZA VAC SPLIT QUAD 0.5 ML IM SUSY
0.5000 mL | PREFILLED_SYRINGE | INTRAMUSCULAR | Status: AC
  Administered 2017-03-13: 0.5 mL via INTRAMUSCULAR
  Filled 2017-03-10 (×2): qty 0.5

## 2017-03-10 MED ORDER — ONDANSETRON HCL 4 MG/2ML IJ SOLN
INTRAMUSCULAR | Status: DC | PRN
  Administered 2017-03-10: 4 mg via INTRAVENOUS

## 2017-03-10 MED ORDER — PHENOL 1.4 % MT LIQD
1.0000 | OROMUCOSAL | Status: DC | PRN
  Filled 2017-03-10: qty 177

## 2017-03-10 MED ORDER — MIDAZOLAM HCL 5 MG/5ML IJ SOLN
INTRAMUSCULAR | Status: DC | PRN
  Administered 2017-03-09: 1 mg via INTRAVENOUS

## 2017-03-10 MED ORDER — ONDANSETRON 4 MG PO TBDP: 4.0000 mg | ORAL_TABLET | Freq: Four times a day (QID) | ORAL | Status: DC | PRN

## 2017-03-10 MED ORDER — SUCCINYLCHOLINE CHLORIDE 20 MG/ML IJ SOLN
INTRAMUSCULAR | Status: DC | PRN
  Administered 2017-03-09: 100 mg via INTRAVENOUS

## 2017-03-10 MED ORDER — SUGAMMADEX SODIUM 200 MG/2ML IV SOLN
INTRAVENOUS | Status: DC | PRN
  Administered 2017-03-10: 200 mg via INTRAVENOUS

## 2017-03-10 MED ORDER — SODIUM CHLORIDE 0.9 % IV BOLUS (SEPSIS)
1000.0000 mL | Freq: Once | INTRAVENOUS | Status: AC
  Administered 2017-03-10: 1000 mL via INTRAVENOUS

## 2017-03-10 MED ORDER — DIPHENHYDRAMINE HCL 50 MG/ML IJ SOLN: 25.0000 mg | Freq: Four times a day (QID) | INTRAMUSCULAR | Status: DC | PRN

## 2017-03-10 MED ORDER — MENTHOL 3 MG MT LOZG
1.0000 | LOZENGE | OROMUCOSAL | Status: DC | PRN
  Filled 2017-03-10: qty 9

## 2017-03-10 MED ORDER — ACETAMINOPHEN 10 MG/ML IV SOLN
1000.0000 mg | Freq: Four times a day (QID) | INTRAVENOUS | Status: AC
  Administered 2017-03-11 (×4): 1000 mg via INTRAVENOUS
  Filled 2017-03-10 (×4): qty 100

## 2017-03-10 MED ORDER — LORATADINE 10 MG PO TABS
10.0000 mg | ORAL_TABLET | Freq: Every day | ORAL | Status: DC
  Administered 2017-03-12 – 2017-03-20 (×9): 10 mg via ORAL
  Filled 2017-03-10 (×11): qty 1

## 2017-03-10 MED ORDER — INSULIN ASPART 100 UNIT/ML ~~LOC~~ SOLN
4.0000 [IU] | Freq: Once | SUBCUTANEOUS | Status: AC
  Administered 2017-03-10: 4 [IU] via SUBCUTANEOUS

## 2017-03-10 MED ORDER — SODIUM CHLORIDE 0.9 % IV SOLN
INTRAVENOUS | Status: DC
  Administered 2017-03-10 – 2017-03-14 (×11): via INTRAVENOUS

## 2017-03-10 MED ORDER — MORPHINE SULFATE (PF) 2 MG/ML IV SOLN: 2.0000 mg | INTRAVENOUS | Status: DC | PRN

## 2017-03-10 MED ORDER — TRAMADOL HCL 50 MG PO TABS
50.0000 mg | ORAL_TABLET | Freq: Four times a day (QID) | ORAL | Status: DC | PRN
  Administered 2017-03-12 – 2017-03-14 (×3): 50 mg via ORAL
  Filled 2017-03-10 (×3): qty 1

## 2017-03-10 MED ORDER — HYDROMORPHONE HCL 1 MG/ML IJ SOLN
INTRAMUSCULAR | Status: AC
  Filled 2017-03-10: qty 1

## 2017-03-10 MED ORDER — DIPHENHYDRAMINE HCL 25 MG PO CAPS: 25.0000 mg | ORAL_CAPSULE | Freq: Four times a day (QID) | ORAL | Status: DC | PRN

## 2017-03-10 MED ORDER — SIMETHICONE 80 MG PO CHEW: 40.0000 mg | CHEWABLE_TABLET | Freq: Four times a day (QID) | ORAL | Status: DC | PRN

## 2017-03-10 MED ORDER — FENTANYL CITRATE (PF) 250 MCG/5ML IJ SOLN
INTRAMUSCULAR | Status: AC
  Filled 2017-03-10: qty 5

## 2017-03-10 NOTE — Progress Notes (Signed)
Inpatient Diabetes Program Recommendations  AACE/ADA: New Consensus Statement on Inpatient Glycemic Control (2015)  Target Ranges:  Prepandial:   less than 140 mg/dL      Peak postprandial:   less than 180 mg/dL (1-2 hours)      Critically ill patients:  140 - 180 mg/dL    Results for Kristi Russell, Kristi Russell (MRN [id number]) as of 03/10/2017 14:56  Ref. Range 03/09/2017 14:36 03/10/2017 03:32  Glucose Latest Ref Range: 65 - 99 mg/dL 336 (H) 287 (H)    Admit with:  Incarcerated hernia, bowel obstruction   NO History of DM noted.  Current Insulin Orders: None     MD- Note patient with elevated lab glucose levels both yesterday and again today.  No History of DM noted.  Please consider the following:  1. Start Novolog Sensitive Correction Scale/ SSI (0-9 units) TID AC + HS  2. Check Hemoglobin A1c level     --Will follow patient during hospitalization--  Wyn Quaker RN, MSN, CDE Diabetes Coordinator Inpatient Glycemic Control Team Team Pager: [phone] (8a-5p)

## 2017-03-10 NOTE — Progress Notes (Signed)
1 Day Post-Op   Subjective/Chief Complaint: Pt with some abd and back pain.   Objective: Vital signs in last 24 hours: Temp:  [97.5 F (36.4 C)-98.5 F (36.9 C)] 98.5 F (36.9 C) (12/31 0450) Pulse Rate:  [98-123] 103 (12/31 0450) Resp:  [16-26] 20 (12/31 0450) BP: (101-158)/(65-117) 106/65 (12/31 0450) SpO2:  [83 %-100 %] 95 % (12/31 0450) Weight:  [88 kg (194 lb)] 88 kg (194 lb) (12/30 1424)    Intake/Output from previous day: 12/30 0701 - 12/31 0700 In: 4075 [I.V.:2600; IV Piggyback:1250] Out: 420 [Urine:305; Emesis/NG output:15; Blood:100] Intake/Output this shift: No intake/output data recorded.  General appearance: alert and cooperative Cardio: regular rate and rhythm, S1, S2 normal, no murmur, click, rub or gallop GI: soft, approp ttp, nd, incision c/d/i, hypoactive BS  Lab Results:  Recent Labs    03/09/17 1436 03/10/17 0332  WBC 14.4* 6.5  HGB 16.4* 13.7  HCT 48.8* 42.0  PLT 347 273   BMET Recent Labs    03/09/17 1436 03/10/17 0332  NA 135 135  K 3.9 3.4*  CL 94* 101  CO2 27 25  GLUCOSE 336* 287*  BUN 8 9  CREATININE 0.77 0.58  CALCIUM 10.8* 8.8*   PT/INR No results for input(s): LABPROT, INR in the last 72 hours. ABG No results for input(s): PHART, HCO3 in the last 72 hours.  Invalid input(s): PCO2, PO2  Studies/Results: Ct Abdomen Pelvis W Contrast  Result Date: 03/09/2017 CLINICAL DATA:  Abdominal pain and nausea with vomiting beginning yesterday. EXAM: CT ABDOMEN AND PELVIS WITH CONTRAST TECHNIQUE: Multidetector CT imaging of the abdomen and pelvis was performed using the standard protocol following bolus administration of intravenous contrast. CONTRAST:  100 mL ISOVUE-300 IOPAMIDOL (ISOVUE-300) INJECTION 61% COMPARISON:  None. FINDINGS: Lower Chest: No acute findings. Hepatobiliary: No hepatic masses identified. Mild diffuse hepatic steatosis. Gallbladder is unremarkable. Pancreas:  No mass or inflammatory changes. Spleen: Within normal  limits in size and appearance. Adrenals/Urinary Tract: Homogeneous left adrenal mass is seen measuring 1.4 cm, which is nonspecific characteristics by CT. Both kidneys are normal in appearance. Unremarkable unopacified urinary bladder. Stomach/Bowel: Moderate dilatation of mid and distal small bowel loops the pelvis which contain fecalized material. Transition point is seen at site of surgical anastomosis in the right lower quadrant, consistent with small-bowel obstruction. Distal ileum and colon are nondilated. There is a paraumbilical ventral hernia containing a dilated small bowel loop which shows mild wall thickening, suspicious for ischemia of herniated bowel loop. Small amount of free fluid seen in the pelvis, however there is no evidence of pneumatosis or free intraperitoneal air. Normal appendix visualized. Vascular/Lymphatic: No pathologically enlarged lymph nodes. No abdominal aortic aneurysm. Reproductive: Prior hysterectomy noted. Adnexal regions are unremarkable in appearance. Other:  None. Musculoskeletal:  No suspicious bone lesions identified. IMPRESSION: Distal small bowel obstruction, with transition point at site of surgical anastomosis and right lower quadrant. Paraumbilical hernia containing a dilated small bowel loop, which shows mild wall thickening suspicious for ischemia. Small amount of free fluid in pelvis. 1.4 cm indeterminate left adrenal mass. Recommend further nonemergent evaluation with adrenal protocol abdomen CT or MRI without and with contrast. This recommendation follows ACR consensus guidelines: Management of Incidental Adrenal Masses: A White Paper of the ACR Incidental Findings Committee. J Am Coll Radiol 2017;14:1038-1044. Electronically Signed   By: Earle Gell M.D.   On: 03/09/2017 19:48   Dg Chest Port 1 View  Result Date: 03/09/2017 CLINICAL DATA:  Shortness breath and chest pressure since  yesterday EXAM: PORTABLE CHEST 1 VIEW COMPARISON:  Aug 02, 2013 FINDINGS: The  heart size and mediastinal contours are within normal limits. There is no focal infiltrate, pulmonary edema, or pleural effusion. The visualized skeletal structures are unremarkable. IMPRESSION: No active disease. Electronically Signed   By: Abelardo Diesel M.D.   On: 03/09/2017 18:19    Anti-infectives: Anti-infectives (From admission, onward)   Start     Dose/Rate Route Frequency Ordered Stop   03/09/17 2315  cefoTEtan (CEFOTAN) 2 g in dextrose 5 % 50 mL IVPB     2 g 100 mL/hr over 30 Minutes Intravenous  Once 03/09/17 2311 03/09/17 2351      Assessment/Plan: s/p Procedure(s): EXPLOR LAP, SMALL BOWEL RESECTION W/ ANASTOMOSIS, LYSIS OF ADHESIONS (N/A) con't NGT  Mobilize Await bowel function Con't NPO  LOS: 0 days    Rosario Jacks., Anne Hahn 03/10/2017

## 2017-03-10 NOTE — Transfer of Care (Signed)
Immediate Anesthesia Transfer of Care Note  Patient: Addilee Neu  Procedure(s) Performed: EXPLOR LAP, SMALL BOWEL RESECTION W/ ANASTOMOSIS, LYSIS OF ADHESIONS (N/A Abdomen)  Patient Location: PACU  Anesthesia Type:General  Level of Consciousness: awake, alert  and oriented  Airway & Oxygen Therapy: Patient Spontanous Breathing and Patient connected to nasal cannula oxygen  Post-op Assessment: Report given to RN and Post -op Vital signs reviewed and stable  Post vital signs: Reviewed and stable  Last Vitals:  Vitals:   03/09/17 2015 03/09/17 2045  BP: (!) 148/104 139/89  Pulse: (!) 107 (!) 109  Resp: 18   Temp:    SpO2: 92% 93%    Last Pain:  Vitals:   03/09/17 2111  TempSrc:   PainSc: 10-Worst pain ever      Patients Stated Pain Goal: 2 (03/19/30 3557)  Complications: No apparent anesthesia complications

## 2017-03-10 NOTE — Progress Notes (Signed)
Pt's temp re-checked orally - 101.6.  PO Tylenol given, pt using IS and MD notified.  Orders received to increase IVF rate.  Order carried out and will continue to monitor.  Eliezer Bottom [place]

## 2017-03-10 NOTE — Anesthesia Postprocedure Evaluation (Signed)
Anesthesia Post Note  Patient: Addilyn Satterwhite  Procedure(s) Performed: EXPLOR LAP, SMALL BOWEL RESECTION W/ ANASTOMOSIS, LYSIS OF ADHESIONS (N/A Abdomen)     Patient location during evaluation: PACU Anesthesia Type: General Level of consciousness: awake and alert Pain management: pain level controlled Vital Signs Assessment: post-procedure vital signs reviewed and stable Respiratory status: spontaneous breathing, nonlabored ventilation, respiratory function stable and patient connected to nasal cannula oxygen Cardiovascular status: blood pressure returned to baseline and stable Postop Assessment: no apparent nausea or vomiting Anesthetic complications: no    Last Vitals:  Vitals:   03/10/17 0449 03/10/17 0450  BP: 106/65 106/65  Pulse: (!) 105 (!) 103  Resp: (!) 21 20  Temp: 36.9 C 36.9 C  SpO2: 93% 95%    Last Pain:  Vitals:   03/10/17 0524  TempSrc:   PainSc: 5                  Lianni Kanaan S

## 2017-03-10 NOTE — Progress Notes (Signed)
Pt continues to have a fever of 101 after Tylenol given, HR - 137, O2 - 83% and came up to 94% on RA.  Pt asymptomatic.  MD notified and new order received for portable CXR.  Will continue to monitor.  Eliezer Bottom [place]

## 2017-03-10 NOTE — Progress Notes (Signed)
Pt's HR 130, asymptomatic.  Other VS stable. Temp 100.1, RN encouraging use of IS.  MD notified.  No new orders received at this time.  Will continue to monitor.  Kristi Russell

## 2017-03-10 NOTE — Op Note (Addendum)
Preoperative diagnosis: bowel obstruction, incarcerated hernia  Postoperative diagnosis: incarcerated hernia, bowel obstruction with phytobezoar  Procedure: exploratory laparotomy, lysis of adhesions, small bowel resection with anastomosis, enterrorhaphy, primary repair of incisional hernia  Surgeon: Gurney Maxin, M.D.  Asst: none  Anesthesia: general  Indications for procedure: Kristi Russell is a 56 y.o. year old female with symptoms of abdominal and vomiting. CT scan was concerning for bowel obstruction and possible strangulated incisional hernia.  Description of procedure: The patient was brought into the operative suite. Anesthesia was administered with General endotracheal anesthesia. WHO checklist was applied. The patient was then placed in supine position. The area was prepped and draped in the usual sterile fashion.  Next, a lower midline incision was made.  Cautery was used to dissect down through subcutaneous tissues.  Hernia sac was identified.  It was partially reduced.  The hernia sac was entered and showed multiple adhesions of piece of small intestine and omentum to the area.  These were taken down with sharp dissection.  Once this was done at the midline fascia was opened with cautery.  There were multiple adhesions of the omentum and small intestine to the abdominal wall as well as multiple interloop adhesions.  Blunt and sharp dissection was used to free the omentum and small intestine from the abdominal wall.  Balfour retractor was put in place.  Small intestine was followed proximally and proximal jejunum showed no adhesions and was decompressed.  Moving more distally there are multiple loops of bowel adhesed to the right lower quadrant and pelvis.  Sharp dissection was used to free these loops of bowel.  Previous small intestine side-by-side functional end-to-end anastomosis was identified in this area and did appear to be the point of transition. The lysis of adhesion portion  of this case took 2h and was the majority of the length of the case. At the intestine of the anastomosis was boggy and was difficult to pass food through the anastomosis appeared to be approximately 2 cm in diameter based off palpation.  Decision was made to remove this for possible stricture.  Therefore enterotomies were made distally and proximally to this anastomosis and a 75 mm GI stapler was used to make a side to side anastomosis.  Once the anastomosis was made a large amount of food material was drained from the proximal end of the intestine consistent with the type of phytobezoar around this anastomosis.  Phytobezoar was removed with suction.  Mesenteric windows were made below the enterotomy space and a 75 mm GI stapler was used to divide off the previous anastomosis and closed the enterotomy sites.  Mesentery was ligated with 3-0 silk stick ties.  1 3-0 silk was used to close small mesenteric defect.  Next I reviewed the intestines involved in the lysis of adhesions there were 3 areas of serosal tears that were repaired with Lembert sutures using 3-0 silk.  Next the abdomen was irrigated with multiple liters of warm saline.  The intestine was then put back into the abdomen in a neutral position.  The abdomen was closed with 0 PDS in running fashion.  The skin was closed with staples.  Dressing was put in place.  Patient awoke from anesthesia and brought back in stable condition.  Findings: phytobezoar associated with previous small intestine anastomosis, bowel obstruction, incisional hernia with partially reduced intestin  Specimen: jejunum  Implant: none   Blood loss: 143ml  Local anesthesia: none  Complications: none  Gurney Maxin, M.D. General, Bariatric, & Minimally Invasive  [facility], [place]

## 2017-03-10 NOTE — Anesthesia Procedure Notes (Signed)
Procedure Name: Intubation Date/Time: 03/10/2017 11:42 PM Performed by: Babs Bertin, CRNA Pre-anesthesia Checklist: Patient identified, Emergency Drugs available, Suction available and Patient being monitored Patient Re-evaluated:Patient Re-evaluated prior to induction Oxygen Delivery Method: Circle System Utilized Preoxygenation: Pre-oxygenation with 100% oxygen Induction Type: IV induction, Rapid sequence and Cricoid Pressure applied Laryngoscope Size: Mac and 3 Grade View: Grade I Tube type: Oral Tube size: 7.0 mm Number of attempts: 1 Airway Equipment and Method: Stylet and Oral airway Placement Confirmation: ETT inserted through vocal cords under direct vision,  positive ETCO2 and breath sounds checked- equal and bilateral Secured at: 21 cm Tube secured with: Tape Dental Injury: Teeth and Oropharynx as per pre-operative assessment

## 2017-03-10 NOTE — Progress Notes (Signed)
Pharmacy Antibiotic Note  Kristi Russell is a 56 y.o. female admitted on 03/09/2017 with intra-abdominal infection. Pharmacy has been consulted for Zosyn dosing.  Tm 102.9, WBC wnl  Cr stable 0.6  Plan: Zosyn 3.375 Gm IV q8h EI  Height: 5\' 3"  (160 cm) Weight: 194 lb (88 kg) IBW/kg (Calculated) : 52.4  Temp (24hrs), Avg:100 F (37.8 C), Min:97.5 F (36.4 C), Max:102.9 F (39.4 C)  Recent Labs  Lab 03/09/17 1436 03/10/17 0332  WBC 14.4* 6.5  CREATININE 0.77 0.58    Estimated Creatinine Clearance: 80.6 mL/min (by C-G formula based on SCr of 0.58 mg/dL).    No Known Allergies  Antimicrobials this admission:   Dose adjustments this admission:   Microbiology results:  Bonnita Nasuti Pharm.D. CPP, BCPS Clinical Pharmacist [phone] 03/10/2017 9:28 PM

## 2017-03-11 LAB — BASIC METABOLIC PANEL
ANION GAP: 8 (ref 5–15)
BUN: 14 mg/dL (ref 6–20)
CHLORIDE: 107 mmol/L (ref 101–111)
CO2: 25 mmol/L (ref 22–32)
Calcium: 8.1 mg/dL — ABNORMAL LOW (ref 8.9–10.3)
Creatinine, Ser: 0.82 mg/dL (ref 0.44–1.00)
GFR calc non Af Amer: >60 mL/min (ref >60)
Glucose, Bld: 257 mg/dL — ABNORMAL HIGH (ref 65–99)
POTASSIUM: 3.4 mmol/L — AB (ref 3.5–5.1)
Sodium: 140 mmol/L (ref 135–145)

## 2017-03-11 LAB — CBC
HEMATOCRIT: 36.1 % (ref 36.0–46.0)
HEMOGLOBIN: 11.4 g/dL — AB (ref 12.0–15.0)
MCH: 31.2 pg (ref 26.0–34.0)
MCHC: 31.6 g/dL (ref 30.0–36.0)
MCV: 98.9 fL (ref 78.0–100.0)
Platelets: 224 10*3/uL (ref 150–400)
RBC: 3.65 MIL/uL — AB (ref 3.87–5.11)
RDW: 13.7 % (ref 11.5–15.5)
WBC: 5 10*3/uL (ref 4.0–10.5)

## 2017-03-11 MED ORDER — WHITE PETROLATUM EX OINT
TOPICAL_OINTMENT | CUTANEOUS | Status: AC
  Administered 2017-03-11: 12:00:00
  Filled 2017-03-11: qty 28.35

## 2017-03-11 NOTE — Progress Notes (Signed)
2 Days Post-Op   Subjective/Chief Complaint: Pt ambulating yesterday Pain improving No bowel function   Objective: Vital signs in last 24 hours: Temp:  [98.9 F (37.2 C)-102.9 F (39.4 C)] 99.2 F (37.3 C) (01/01 0343) Pulse Rate:  [116-138] 116 (01/01 0343) Resp:  [17-22] 17 (01/01 0343) BP: (95-117)/(60-75) 97/60 (01/01 0343) SpO2:  [83 %-95 %] 92 % (01/01 0343) Last BM Date: 03/08/17  Intake/Output from previous day: 12/31 0701 - 01/01 0700 In: 2520.4 [I.V.:2220.4; IV Piggyback:300] Out: 1275 [Urine:975; Emesis/NG output:300] Intake/Output this shift: Total I/O In: -  Out: 100 [Urine:100]  General appearance: alert and cooperative GI: soft, approp ttp, nd, hypoactive BS, incision c/d/i  Lab Results:  Recent Labs    03/10/17 0332 03/11/17 0331  WBC 6.5 5.0  HGB 13.7 11.4*  HCT 42.0 36.1  PLT 273 224   BMET Recent Labs    03/10/17 0332 03/11/17 0331  NA 135 140  K 3.4* 3.4*  CL 101 107  CO2 25 25  GLUCOSE 287* 257*  BUN 9 14  CREATININE 0.58 0.82  CALCIUM 8.8* 8.1*   PT/INR No results for input(s): LABPROT, INR in the last 72 hours. ABG No results for input(s): PHART, HCO3 in the last 72 hours.  Invalid input(s): PCO2, PO2  Studies/Results: Ct Abdomen Pelvis W Contrast  Result Date: 03/09/2017 CLINICAL DATA:  Abdominal pain and nausea with vomiting beginning yesterday. EXAM: CT ABDOMEN AND PELVIS WITH CONTRAST TECHNIQUE: Multidetector CT imaging of the abdomen and pelvis was performed using the standard protocol following bolus administration of intravenous contrast. CONTRAST:  100 mL ISOVUE-300 IOPAMIDOL (ISOVUE-300) INJECTION 61% COMPARISON:  None. FINDINGS: Lower Chest: No acute findings. Hepatobiliary: No hepatic masses identified. Mild diffuse hepatic steatosis. Gallbladder is unremarkable. Pancreas:  No mass or inflammatory changes. Spleen: Within normal limits in size and appearance. Adrenals/Urinary Tract: Homogeneous left adrenal mass is  seen measuring 1.4 cm, which is nonspecific characteristics by CT. Both kidneys are normal in appearance. Unremarkable unopacified urinary bladder. Stomach/Bowel: Moderate dilatation of mid and distal small bowel loops the pelvis which contain fecalized material. Transition point is seen at site of surgical anastomosis in the right lower quadrant, consistent with small-bowel obstruction. Distal ileum and colon are nondilated. There is a paraumbilical ventral hernia containing a dilated small bowel loop which shows mild wall thickening, suspicious for ischemia of herniated bowel loop. Small amount of free fluid seen in the pelvis, however there is no evidence of pneumatosis or free intraperitoneal air. Normal appendix visualized. Vascular/Lymphatic: No pathologically enlarged lymph nodes. No abdominal aortic aneurysm. Reproductive: Prior hysterectomy noted. Adnexal regions are unremarkable in appearance. Other:  None. Musculoskeletal:  No suspicious bone lesions identified. IMPRESSION: Distal small bowel obstruction, with transition point at site of surgical anastomosis and right lower quadrant. Paraumbilical hernia containing a dilated small bowel loop, which shows mild wall thickening suspicious for ischemia. Small amount of free fluid in pelvis. 1.4 cm indeterminate left adrenal mass. Recommend further nonemergent evaluation with adrenal protocol abdomen CT or MRI without and with contrast. This recommendation follows ACR consensus guidelines: Management of Incidental Adrenal Masses: A White Paper of the ACR Incidental Findings Committee. J Am Coll Radiol 2017;14:1038-1044. Electronically Signed   By: Earle Gell M.D.   On: 03/09/2017 19:48   Dg Chest Port 1 View  Result Date: 03/10/2017 CLINICAL DATA:  Fever EXAM: PORTABLE CHEST 1 VIEW COMPARISON:  03/09/2017 FINDINGS: The heart size is borderline enlarged in part due to the AP portable technique. Slight uncoiling  of the thoracic aorta without aneurysm. Lung  volumes are slightly low. New bibasilar subsegmental atelectasis. No effusion nor overt pulmonary edema. No pneumonic consolidations. Gastric tip and side-port are seen in the left upper quadrant of the abdomen in the expected location of the stomach. The visualized skeletal structures are unremarkable. IMPRESSION: 1. Low lung volumes with bibasilar subsegmental atelectasis. 2. Gastric tube tip and side-port are noted in the expected location the stomach. Electronically Signed   By: Ashley Royalty M.D.   On: 03/10/2017 19:41   Dg Chest Port 1 View  Result Date: 03/09/2017 CLINICAL DATA:  Shortness breath and chest pressure since yesterday EXAM: PORTABLE CHEST 1 VIEW COMPARISON:  Aug 02, 2013 FINDINGS: The heart size and mediastinal contours are within normal limits. There is no focal infiltrate, pulmonary edema, or pleural effusion. The visualized skeletal structures are unremarkable. IMPRESSION: No active disease. Electronically Signed   By: Abelardo Diesel M.D.   On: 03/09/2017 18:19    Anti-infectives: Anti-infectives (From admission, onward)   Start     Dose/Rate Route Frequency Ordered Stop   03/10/17 2200  piperacillin-tazobactam (ZOSYN) IVPB 3.375 g     3.375 g 12.5 mL/hr over 240 Minutes Intravenous Every 8 hours 03/10/17 2125     03/09/17 2315  cefoTEtan (CEFOTAN) 2 g in dextrose 5 % 50 mL IVPB     2 g 100 mL/hr over 30 Minutes Intravenous  Once 03/09/17 2311 03/09/17 2351      Assessment/Plan: s/p Procedure(s): EXPLOR LAP, SMALL BOWEL RESECTION W/ ANASTOMOSIS, LYSIS OF ADHESIONS (N/A) Con't NGT, await bowel function Mobilize. Pain better this AM. con't IV orfimev Pulm toilet  LOS: 1 day    Rosario Jacks., Anne Hahn 03/11/2017

## 2017-03-12 LAB — CBC
HCT: 34 % — ABNORMAL LOW (ref 36.0–46.0)
Hemoglobin: 10.5 g/dL — ABNORMAL LOW (ref 12.0–15.0)
MCH: 31.3 pg (ref 26.0–34.0)
MCHC: 30.9 g/dL (ref 30.0–36.0)
MCV: 101.2 fL — AB (ref 78.0–100.0)
PLATELETS: 224 10*3/uL (ref 150–400)
RBC: 3.36 MIL/uL — AB (ref 3.87–5.11)
RDW: 13.9 % (ref 11.5–15.5)
WBC: 6.6 10*3/uL (ref 4.0–10.5)

## 2017-03-12 LAB — BASIC METABOLIC PANEL
Anion gap: 7 (ref 5–15)
BUN: 6 mg/dL (ref 6–20)
CO2: 25 mmol/L (ref 22–32)
CREATININE: 0.61 mg/dL (ref 0.44–1.00)
Calcium: 8.8 mg/dL — ABNORMAL LOW (ref 8.9–10.3)
Chloride: 110 mmol/L (ref 101–111)
GFR calc Af Amer: >60 mL/min (ref >60)
GFR calc non Af Amer: >60 mL/min (ref >60)
GLUCOSE: 127 mg/dL — AB (ref 65–99)
POTASSIUM: 3.1 mmol/L — AB (ref 3.5–5.1)
Sodium: 142 mmol/L (ref 135–145)

## 2017-03-12 MED ORDER — SODIUM CHLORIDE 0.45 % IV BOLUS
500.0000 mL | Freq: Once | INTRAVENOUS | Status: AC
  Administered 2017-03-12: 500 mL via INTRAVENOUS

## 2017-03-12 MED ORDER — POTASSIUM CHLORIDE 10 MEQ/100ML IV SOLN
10.0000 meq | INTRAVENOUS | Status: AC
  Administered 2017-03-12 (×3): 10 meq via INTRAVENOUS
  Filled 2017-03-12 (×3): qty 100

## 2017-03-12 MED ORDER — ACETAMINOPHEN 10 MG/ML IV SOLN
1000.0000 mg | Freq: Four times a day (QID) | INTRAVENOUS | Status: AC
  Administered 2017-03-12 – 2017-03-13 (×4): 1000 mg via INTRAVENOUS
  Filled 2017-03-12 (×4): qty 100

## 2017-03-12 MED ORDER — MORPHINE SULFATE (PF) 4 MG/ML IV SOLN
2.0000 mg | INTRAVENOUS | Status: DC | PRN
  Administered 2017-03-12 – 2017-03-13 (×5): 2 mg via INTRAVENOUS
  Filled 2017-03-12 (×5): qty 1

## 2017-03-12 MED ORDER — ORAL CARE MOUTH RINSE
15.0000 mL | Freq: Two times a day (BID) | OROMUCOSAL | Status: DC
  Administered 2017-03-12 – 2017-03-20 (×15): 15 mL via OROMUCOSAL

## 2017-03-12 NOTE — Progress Notes (Signed)
Patient ID: Kristi Russell, female   DOB: Feb 18, 1961, 57 y.o.   MRN: [id number]  [facility] Surgery Progress Note  3 Days Post-Op  Subjective: CC-  Sitting up in chair. Asking for something to drink. States that abdominal pain is improving. Denies n/v. NG output down to 200cc last 24 hours. No flatus or BM. Continues to have low grade fever. WBC 6.7. HR 105-118. Hg 10.5 from 11.4. She does report a productive cough. CXR 12/31 showed atelectasis. Denies dysuria.   Objective: Vital signs in last 24 hours: Temp:  [99.3 F (37.4 C)-99.7 F (37.6 C)] 99.3 F (37.4 C) (01/02 0416) Pulse Rate:  [105-118] 107 (01/02 0416) Resp:  [15-16] 15 (01/02 0416) BP: (106-134)/(62-84) 134/84 (01/02 0416) SpO2:  [95 %-97 %] 97 % (01/02 0416) Last BM Date: 03/08/17  Intake/Output from previous day: 01/01 0701 - 01/02 0700 In: 1944.7 [I.V.:1794.7; IV Piggyback:100] Out: 1075 [Urine:875; Emesis/NG output:200] Intake/Output this shift: No intake/output data recorded.  PE: Gen:  Alert, NAD, pleasant HEENT: EOM's intact, pupils equal and round Card:  tachycardic Pulm:  Few bilateral rhonchi, no wheezes, effort normal Abd: Soft, mild distension, few BS heard, midline incision with honeycomb dressing in place/trace drive blood Ext: calves soft and nontender, no edema Psych: A&Ox3  Skin: no rashes noted, warm and dry  Lab Results:  Recent Labs    03/11/17 0331 03/12/17 0550  WBC 5.0 6.6  HGB 11.4* 10.5*  HCT 36.1 34.0*  PLT 224 224   BMET Recent Labs    03/11/17 0331 03/12/17 0550  NA 140 142  K 3.4* 3.1*  CL 107 110  CO2 25 25  GLUCOSE 257* 127*  BUN 14 6  CREATININE 0.82 0.61  CALCIUM 8.1* 8.8*   PT/INR No results for input(s): LABPROT, INR in the last 72 hours. CMP     Component Value Date/Time   NA 142 03/12/2017 0550   K 3.1 (L) 03/12/2017 0550   CL 110 03/12/2017 0550   CO2 25 03/12/2017 0550   GLUCOSE 127 (H) 03/12/2017 0550   BUN 6 03/12/2017 0550   CREATININE 0.61 03/12/2017 0550   CALCIUM 8.8 (L) 03/12/2017 0550   PROT 7.8 03/09/2017 1436   ALBUMIN 4.0 03/09/2017 1436   AST 13 (L) 03/09/2017 1436   ALT 17 03/09/2017 1436   ALKPHOS 74 03/09/2017 1436   BILITOT 1.1 03/09/2017 1436   GFRNONAA >60 03/12/2017 0550   GFRAA >60 03/12/2017 0550   Lipase     Component Value Date/Time   LIPASE 25 03/09/2017 1436       Studies/Results: Dg Chest Port 1 View  Result Date: 03/10/2017 CLINICAL DATA:  Fever EXAM: PORTABLE CHEST 1 VIEW COMPARISON:  03/09/2017 FINDINGS: The heart size is borderline enlarged in part due to the AP portable technique. Slight uncoiling of the thoracic aorta without aneurysm. Lung volumes are slightly low. New bibasilar subsegmental atelectasis. No effusion nor overt pulmonary edema. No pneumonic consolidations. Gastric tip and side-port are seen in the left upper quadrant of the abdomen in the expected location of the stomach. The visualized skeletal structures are unremarkable. IMPRESSION: 1. Low lung volumes with bibasilar subsegmental atelectasis. 2. Gastric tube tip and side-port are noted in the expected location the stomach. Electronically Signed   By: Ashley Royalty M.D.   On: 03/10/2017 19:41    Anti-infectives: Anti-infectives (From admission, onward)   Start     Dose/Rate Route Frequency Ordered Stop   03/10/17 2200  piperacillin-tazobactam (ZOSYN) IVPB 3.375 g  Status:  Discontinued     3.375 g 12.5 mL/hr over 240 Minutes Intravenous Every 8 hours 03/10/17 2125 03/11/17 0805   03/09/17 2315  cefoTEtan (CEFOTAN) 2 g in dextrose 5 % 50 mL IVPB     2 g 100 mL/hr over 30 Minutes Intravenous  Once 03/09/17 2311 03/09/17 2351       Assessment/Plan HTN Tobacco abuse  Incarcerated hernia, bowel obstruction with phytobezoar S/p exploratory laparotomy, lysis of adhesions, small bowel resection with anastomosis, enterrorhaphy, primary repair of incisional hernia 12/31 Dr. Kieth Brightly - POD 2 - NG tube with  200cc/24hr - no flatus or BM - WBC 6.6, TMAX 99.7, HR 105-118  ID - zosyn 12/31>>1/1, cefotetan perioperative FEN - IVF, NPO/NGT VTE - SCDs, lovenox  Plan - Continue NPO/NGT until bowel function returns. Encourage mobilization/IS today. Replace potassium. Labs in AM. Continue ofirmev.   LOS: 2 days    Wellington Hampshire , [facility] 03/12/2017, 9:19 AM Pager: [phone] Consults: [phone] Mon-Fri 7:00 am-4:30 pm Sat-Sun 7:00 am-11:30 am

## 2017-03-13 ENCOUNTER — Inpatient Hospital Stay (HOSPITAL_COMMUNITY): Payer: Self-pay

## 2017-03-13 LAB — CBC
HCT: 33.8 % — ABNORMAL LOW (ref 36.0–46.0)
Hemoglobin: 10.5 g/dL — ABNORMAL LOW (ref 12.0–15.0)
MCH: 31.3 pg (ref 26.0–34.0)
MCHC: 31.1 g/dL (ref 30.0–36.0)
MCV: 100.9 fL — AB (ref 78.0–100.0)
PLATELETS: 254 10*3/uL (ref 150–400)
RBC: 3.35 MIL/uL — ABNORMAL LOW (ref 3.87–5.11)
RDW: 14 % (ref 11.5–15.5)
WBC: 9.7 10*3/uL (ref 4.0–10.5)

## 2017-03-13 LAB — BASIC METABOLIC PANEL
ANION GAP: 8 (ref 5–15)
BUN: 6 mg/dL (ref 6–20)
CALCIUM: 9.3 mg/dL (ref 8.9–10.3)
CO2: 23 mmol/L (ref 22–32)
Chloride: 110 mmol/L (ref 101–111)
Creatinine, Ser: 0.57 mg/dL (ref 0.44–1.00)
GLUCOSE: 126 mg/dL — AB (ref 65–99)
Potassium: 3.3 mmol/L — ABNORMAL LOW (ref 3.5–5.1)
SODIUM: 141 mmol/L (ref 135–145)

## 2017-03-13 MED ORDER — ACETAMINOPHEN 10 MG/ML IV SOLN
1000.0000 mg | Freq: Four times a day (QID) | INTRAVENOUS | Status: AC
  Administered 2017-03-13 – 2017-03-14 (×4): 1000 mg via INTRAVENOUS
  Filled 2017-03-13 (×4): qty 100

## 2017-03-13 MED ORDER — POTASSIUM CHLORIDE 10 MEQ/100ML IV SOLN
10.0000 meq | INTRAVENOUS | Status: AC
  Administered 2017-03-13 (×3): 10 meq via INTRAVENOUS
  Filled 2017-03-13 (×3): qty 100

## 2017-03-13 MED ORDER — HYDROMORPHONE HCL 1 MG/ML IJ SOLN
1.0000 mg | INTRAMUSCULAR | Status: DC | PRN
  Administered 2017-03-13 – 2017-03-15 (×7): 1 mg via INTRAVENOUS
  Filled 2017-03-13 (×8): qty 1

## 2017-03-13 NOTE — Progress Notes (Signed)
Patient ID: Kristi Russell, female   DOB: 08-13-1960, 57 y.o.   MRN: [id number]  [facility] Surgery Progress Note  4 Days Post-Op  Subjective: CC-  No BM or flatus yet. Ambulated in halls multiple times yesterday and spent most of the day OOB in chair. Abdominal pain well controlled.  Objective: Vital signs in last 24 hours: Temp:  [98.2 F (36.8 C)-99.7 F (37.6 C)] 98.2 F (36.8 C) (01/03 0428) Pulse Rate:  [109-117] 109 (01/03 0428) Resp:  [16] 16 (01/03 0428) BP: (112-143)/(72-96) 143/96 (01/03 0428) SpO2:  [93 %-99 %] 99 % (01/03 0428) Last BM Date: 03/08/17  Intake/Output from previous day: 01/02 0701 - 01/03 0700 In: 3441.7 [P.O.:60; I.V.:3121.7; NG/GT:160; IV Piggyback:100] Out: 1350 [Urine:1000; Emesis/NG output:350] Intake/Output this shift: No intake/output data recorded.  PE: Gen:  Alert, NAD, pleasant HEENT: EOM's intact, pupils equal and round Card:  tachycardic Pulm: effort normal Abd: Soft, mild distension, + BS, midline incision with honeycomb dressing in place/trace dried blood Psych: A&Ox3   Lab Results:  Recent Labs    03/12/17 0550 03/13/17 0434  WBC 6.6 9.7  HGB 10.5* 10.5*  HCT 34.0* 33.8*  PLT 224 254   BMET Recent Labs    03/12/17 0550 03/13/17 0434  NA 142 141  K 3.1* 3.3*  CL 110 110  CO2 25 23  GLUCOSE 127* 126*  BUN 6 6  CREATININE 0.61 0.57  CALCIUM 8.8* 9.3   PT/INR No results for input(s): LABPROT, INR in the last 72 hours. CMP     Component Value Date/Time   NA 141 03/13/2017 0434   K 3.3 (L) 03/13/2017 0434   CL 110 03/13/2017 0434   CO2 23 03/13/2017 0434   GLUCOSE 126 (H) 03/13/2017 0434   BUN 6 03/13/2017 0434   CREATININE 0.57 03/13/2017 0434   CALCIUM 9.3 03/13/2017 0434   PROT 7.8 03/09/2017 1436   ALBUMIN 4.0 03/09/2017 1436   AST 13 (L) 03/09/2017 1436   ALT 17 03/09/2017 1436   ALKPHOS 74 03/09/2017 1436   BILITOT 1.1 03/09/2017 1436   GFRNONAA >60 03/13/2017 0434   GFRAA >60 03/13/2017 0434    Lipase     Component Value Date/Time   LIPASE 25 03/09/2017 1436       Studies/Results: No results found.  Anti-infectives: Anti-infectives (From admission, onward)   Start     Dose/Rate Route Frequency Ordered Stop   03/10/17 2200  piperacillin-tazobactam (ZOSYN) IVPB 3.375 g  Status:  Discontinued     3.375 g 12.5 mL/hr over 240 Minutes Intravenous Every 8 hours 03/10/17 2125 03/11/17 0805   03/09/17 2315  cefoTEtan (CEFOTAN) 2 g in dextrose 5 % 50 mL IVPB     2 g 100 mL/hr over 30 Minutes Intravenous  Once 03/09/17 2311 03/09/17 2351       Assessment/Plan HTN Tobacco abuse  Incarcerated hernia, bowel obstruction with phytobezoar S/p exploratory laparotomy, lysis of adhesions, small bowel resection with anastomosis, enterrorhaphy, primary repair of incisional hernia 12/31 Dr. Kieth Brightly - POD 3 - NG tube with 350cc/24hr - no flatus or BM - WBC 9.7, TMAX 99.7, HR 109-117  ID - zosyn 12/31>>1/1, cefotetan perioperative FEN - IVF, NPO/NGT VTE - SCDs, lovenox  Plan - Xray to confirm NG tube in proper location. Continue NPO/NGT until bowel function returns, ok to sip on few liquids/ice to keep mouth wet. Mobilize/IS. Replace potassium. Labs in AM. Continue ofirmev.    LOS: 3 days    Wellington Hampshire ,  [facility] 03/13/2017, 8:10 AM Pager: [phone] Consults: [phone] Mon-Fri 7:00 am-4:30 pm Sat-Sun 7:00 am-11:30 am

## 2017-03-13 NOTE — Progress Notes (Signed)
RN notified

## 2017-03-14 LAB — CBC
HCT: 33.2 % — ABNORMAL LOW (ref 36.0–46.0)
Hemoglobin: 9.9 g/dL — ABNORMAL LOW (ref 12.0–15.0)
MCH: 30 pg (ref 26.0–34.0)
MCHC: 29.8 g/dL — AB (ref 30.0–36.0)
MCV: 100.6 fL — AB (ref 78.0–100.0)
Platelets: 262 10*3/uL (ref 150–400)
RBC: 3.3 MIL/uL — AB (ref 3.87–5.11)
RDW: 13.7 % (ref 11.5–15.5)
WBC: 9.3 10*3/uL (ref 4.0–10.5)

## 2017-03-14 LAB — BASIC METABOLIC PANEL
Anion gap: 9 (ref 5–15)
BUN: 6 mg/dL (ref 6–20)
CHLORIDE: 113 mmol/L — AB (ref 101–111)
CO2: 22 mmol/L (ref 22–32)
CREATININE: 0.62 mg/dL (ref 0.44–1.00)
Calcium: 9.3 mg/dL (ref 8.9–10.3)
GFR calc non Af Amer: >60 mL/min (ref >60)
Glucose, Bld: 138 mg/dL — ABNORMAL HIGH (ref 65–99)
POTASSIUM: 3.6 mmol/L (ref 3.5–5.1)
SODIUM: 144 mmol/L (ref 135–145)

## 2017-03-14 MED ORDER — METOPROLOL TARTRATE 5 MG/5ML IV SOLN
5.0000 mg | Freq: Four times a day (QID) | INTRAVENOUS | Status: DC | PRN
  Administered 2017-03-14: 5 mg via INTRAVENOUS
  Filled 2017-03-14: qty 5

## 2017-03-14 MED ORDER — ACETAMINOPHEN 325 MG PO TABS
650.0000 mg | ORAL_TABLET | Freq: Four times a day (QID) | ORAL | Status: DC
  Administered 2017-03-14 – 2017-03-20 (×20): 650 mg via ORAL
  Filled 2017-03-14 (×21): qty 2

## 2017-03-14 NOTE — Progress Notes (Signed)
Patient ID: Kristi Russell, female   DOB: 1960/08/03, 57 y.o.   MRN: [id number]  [facility] Surgery Progress Note  5 Days Post-Op  Subjective: CC-  Sitting up in chair. No BM or flatus but feels like she may pass some soon. Appetite returning. States that her throat/ear pain is much worse than her abdominal pain.  Objective: Vital signs in last 24 hours: Temp:  [98.8 F (37.1 C)-99.2 F (37.3 C)] 98.9 F (37.2 C) (01/04 0510) Pulse Rate:  [98-104] 99 (01/04 0510) Resp:  [16] 16 (01/04 0510) BP: (112-131)/(56-87) 112/56 (01/04 0510) SpO2:  [93 %-97 %] 95 % (01/04 0510) Last BM Date: 03/08/17  Intake/Output from previous day: 01/03 0701 - 01/04 0700 In: 2018.3 [P.O.:60; I.V.:1748.3; NG/GT:110; IV Piggyback:100] Out: 1350 [Urine:850; Emesis/NG output:500] Intake/Output this shift: Total I/O In: -  Out: 350 [Urine:250; Emesis/NG output:100]  PE: Gen: Alert, NAD, pleasant HEENT: EOM's intact, pupils equal and round Card: RRR Pulm:effort normal, CTAB Abd: Soft,mild distension,+BS,midlineincision with honeycomb dressing in place/trace dried blood Psych: A&Ox3   Lab Results:  Recent Labs    03/13/17 0434 03/14/17 0546  WBC 9.7 9.3  HGB 10.5* 9.9*  HCT 33.8* 33.2*  PLT 254 262   BMET Recent Labs    03/13/17 0434 03/14/17 0546  NA 141 144  K 3.3* 3.6  CL 110 113*  CO2 23 22  GLUCOSE 126* 138*  BUN 6 6  CREATININE 0.57 0.62  CALCIUM 9.3 9.3   PT/INR No results for input(s): LABPROT, INR in the last 72 hours. CMP     Component Value Date/Time   NA 144 03/14/2017 0546   K 3.6 03/14/2017 0546   CL 113 (H) 03/14/2017 0546   CO2 22 03/14/2017 0546   GLUCOSE 138 (H) 03/14/2017 0546   BUN 6 03/14/2017 0546   CREATININE 0.62 03/14/2017 0546   CALCIUM 9.3 03/14/2017 0546   PROT 7.8 03/09/2017 1436   ALBUMIN 4.0 03/09/2017 1436   AST 13 (L) 03/09/2017 1436   ALT 17 03/09/2017 1436   ALKPHOS 74 03/09/2017 1436   BILITOT 1.1 03/09/2017 1436   GFRNONAA >60 03/14/2017 0546   GFRAA >60 03/14/2017 0546   Lipase     Component Value Date/Time   LIPASE 25 03/09/2017 1436       Studies/Results: Dg Abd Portable 1v  Result Date: 03/13/2017 CLINICAL DATA:  57 year old female with a history of nasogastric tube placement. Recent surgery EXAM: PORTABLE ABDOMEN - 1 VIEW COMPARISON:  CT 03/09/2017 FINDINGS: Limited plain film of the abdomen. Gastric tube terminates within the left upper quadrant with the side-port beyond the GE junction. Small volume gas within stomach, small bowel. Partial dilation of mid abdominal small bowel loop. Gas extends through the left colon. Surgical clips along the lobe midline abdomen IMPRESSION: Mild distention of mid abdominal small bowel loop, potentially postoperative ileus, though developing obstruction not excluded. Gastric tube terminates within left upper quadrant. Electronically Signed   By: Corrie Mckusick D.O.   On: 03/13/2017 09:20    Anti-infectives: Anti-infectives (From admission, onward)   Start     Dose/Rate Route Frequency Ordered Stop   03/10/17 2200  piperacillin-tazobactam (ZOSYN) IVPB 3.375 g  Status:  Discontinued     3.375 g 12.5 mL/hr over 240 Minutes Intravenous Every 8 hours 03/10/17 2125 03/11/17 0805   03/09/17 2315  cefoTEtan (CEFOTAN) 2 g in dextrose 5 % 50 mL IVPB     2 g 100 mL/hr over 30 Minutes Intravenous  Once 03/09/17  2311 03/09/17 2351       Assessment/Plan HTN Tobacco abuse  Incarcerated hernia, bowel obstruction with phytobezoar S/pexploratory laparotomy, lysis of adhesions, small bowel resection with anastomosis, enterrorhaphy, primary repair of incisional hernia12/31 Dr. Kieth Brightly - POD 4 - NG tube with 390cc/24hr - no flatus or BM - WBC 9.3, TMAX 99.2, HR 98  ID -zosyn 12/31>>1/1, cefotetan perioperative FEN -IVF, clear liquids VTE -SCDs, lovenox  Plan- D/c NG tube and advance to clear liquids. Add PO tylenol. Continue ambulating. Can remove  honeycomb dressing tomorrow.   LOS: 4 days    Wellington Hampshire , [facility] Surgery 03/14/2017, 9:27 AM Pager: [phone] Consults: [phone] Mon-Fri 7:00 am-4:30 pm Sat-Sun 7:00 am-11:30 am

## 2017-03-14 NOTE — Progress Notes (Signed)
Vomited approximately about 100 ml bile colored vomitus. Zofran given.  Will continue to monitor.

## 2017-03-15 LAB — BASIC METABOLIC PANEL
Anion gap: 7 (ref 5–15)
BUN: 5 mg/dL — ABNORMAL LOW (ref 6–20)
CHLORIDE: 110 mmol/L (ref 101–111)
CO2: 28 mmol/L (ref 22–32)
Calcium: 9.1 mg/dL (ref 8.9–10.3)
Creatinine, Ser: 0.52 mg/dL (ref 0.44–1.00)
GFR calc Af Amer: >60 mL/min (ref >60)
GFR calc non Af Amer: >60 mL/min (ref >60)
Glucose, Bld: 156 mg/dL — ABNORMAL HIGH (ref 65–99)
POTASSIUM: 3.6 mmol/L (ref 3.5–5.1)
SODIUM: 145 mmol/L (ref 135–145)

## 2017-03-15 LAB — CBC
HEMATOCRIT: 33.5 % — AB (ref 36.0–46.0)
HEMOGLOBIN: 10.2 g/dL — AB (ref 12.0–15.0)
MCH: 30.3 pg (ref 26.0–34.0)
MCHC: 30.4 g/dL (ref 30.0–36.0)
MCV: 99.4 fL (ref 78.0–100.0)
Platelets: 301 10*3/uL (ref 150–400)
RBC: 3.37 MIL/uL — AB (ref 3.87–5.11)
RDW: 13.6 % (ref 11.5–15.5)
WBC: 7.6 10*3/uL (ref 4.0–10.5)

## 2017-03-15 MED ORDER — HYDROMORPHONE HCL 1 MG/ML IJ SOLN
0.5000 mg | INTRAMUSCULAR | Status: DC | PRN
  Filled 2017-03-15 (×2): qty 1

## 2017-03-15 MED ORDER — POTASSIUM CHLORIDE 2 MEQ/ML IV SOLN
INTRAVENOUS | Status: DC
  Administered 2017-03-15 – 2017-03-19 (×9): via INTRAVENOUS
  Filled 2017-03-15 (×11): qty 1000

## 2017-03-15 MED ORDER — BISACODYL 10 MG RE SUPP
10.0000 mg | Freq: Two times a day (BID) | RECTAL | Status: AC
  Administered 2017-03-15 (×2): 10 mg via RECTAL
  Filled 2017-03-15 (×2): qty 1

## 2017-03-15 MED ORDER — KETOROLAC TROMETHAMINE 15 MG/ML IJ SOLN
15.0000 mg | Freq: Four times a day (QID) | INTRAMUSCULAR | Status: AC | PRN
  Administered 2017-03-15 – 2017-03-16 (×3): 15 mg via INTRAVENOUS
  Filled 2017-03-15 (×3): qty 1

## 2017-03-15 MED ORDER — HYDROCODONE-ACETAMINOPHEN 5-325 MG PO TABS: 1.0000 | ORAL_TABLET | ORAL | Status: DC | PRN

## 2017-03-15 NOTE — Progress Notes (Addendum)
6 Days Post-Op  Subjective: Stable and alert.  Afebrile.  Vital signs stable.   States pain is controlled when she gets an injection. Slow to mobilize.  States she ambulated once 100 mL bilious emesis last night No stool or flatus  Objective: Vital signs in last 24 hours: Temp:  [98.7 F (37.1 C)-99.8 F (37.7 C)] 98.7 F (37.1 C) (01/04 2209) Pulse Rate:  [91-110] 99 (01/04 2209) Resp:  [16-18] 18 (01/04 2209) BP: (140-202)/(94-114) 140/94 (01/04 2209) SpO2:  [97 %-98 %] 97 % (01/04 2209) Last BM Date: 03/08/17  Intake/Output from previous day: 01/04 0701 - 01/05 0700 In: 800 [I.V.:800] Out: 350 [Urine:250; Emesis/NG output:100] Intake/Output this shift: No intake/output data recorded.  General appearance: Alert.  Answers questions appropriately.  Mental status normal.  No obvious distress. Resp: clear to auscultation bilaterally GI: Mild obesity.  Soft.  Midline wound clean.  Honeycomb bandage removed.  Staples intact.  No drainage or infection.  Rare bowel sounds.  Lab Results:  No results found for this or any previous visit (from the past 24 hour(s)).   Studies/Results: No results found.  Marland Kitchen acetaminophen  650 mg Oral Q6H  . bisacodyl  10 mg Rectal BID  . enoxaparin (LOVENOX) injection  40 mg Subcutaneous Q24H  . fluticasone  1 spray Each Nare Daily  . loratadine  10 mg Oral Daily  . mouth rinse  15 mL Mouth Rinse BID     Assessment/Plan: s/p Procedure(s): EXPLOR LAP, SMALL BOWEL RESECTION W/ ANASTOMOSIS, LYSIS OF ADHESIONS    Incarcerated hernia, bowel obstruction with phytobezoar S/pexploratory laparotomy, lysis of adhesions, small bowel resection with anastomosis, enterrorhaphy, primary repair of incisional hernia12/31 Dr. Kieth Brightly - POD5 -Still has ileus.  Do not advance diet.  Decrease narcotic dosages.  Tried Dulcolax suppository.  Encourage more ambulation.  ID -zosyn 12/31>>1/1, cefotetan perioperative FEN -IVF, clear liquids VTE -SCDs,  lovenox  HTN Tobacco abuse  Plan-  continue clear liquids due to ileus.  Increase ambulation.  Decrease narcotic and substitute tramadol or toradol.    @PROBHOSP @  LOS: 5 days    Kristi Russell 03/15/2017  . .prob

## 2017-03-16 ENCOUNTER — Inpatient Hospital Stay (HOSPITAL_COMMUNITY): Payer: Self-pay

## 2017-03-16 NOTE — Progress Notes (Signed)
7 Days Post-Op  Subjective: Had 3 stools and passing flatus but still does not feel well, feels bloated, and states she had some low volume emesis. Emesis was not documented on the nursing chart Voiding without difficulty Afebrile.  Heart rate 88.  Stable Potassium 3.6.  Glucose 156.  Creatinine 0.52.  WBC 7600, hemoglobin 10.2.   Objective: Vital signs in last 24 hours: Temp:  [98.5 F (36.9 C)-99 F (37.2 C)] 98.5 F (36.9 C) (01/06 0522) Pulse Rate:  [88-100] 88 (01/06 0522) Resp:  [17-18] 17 (01/06 0522) BP: (147-166)/(91-98) 162/96 (01/06 0522) SpO2:  [91 %-94 %] 91 % (01/06 0522) Last BM Date: 03/15/17  Intake/Output from previous day: 01/05 0701 - 01/06 0700 In: 2860 [P.O.:745; I.V.:2115] Out: 7 [Urine:4; Stool:3] Intake/Output this shift: No intake/output data recorded.    General appearance: Alert.  Answers questions appropriately.  Mental status normal.  No obvious distress. Resp: clear to auscultation bilaterally GI: Mild obesity.  Soft.    Mild distention.  Hypoactive bowel sounds.  Midline wound clean.  Honeycomb bandage removed.  Staples intact.  No drainage or infection.  .      Lab Results:  No results found for this or any previous visit (from the past 24 hour(s)).   Studies/Results: No results found.  Marland Kitchen acetaminophen  650 mg Oral Q6H  . enoxaparin (LOVENOX) injection  40 mg Subcutaneous Q24H  . fluticasone  1 spray Each Nare Daily  . loratadine  10 mg Oral Daily  . mouth rinse  15 mL Mouth Rinse BID     Assessment/Plan: s/p Procedure(s): EXPLOR LAP, SMALL BOWEL RESECTION W/ anastomosis, lysis of adhesion   Incarcerated hernia, bowel obstruction with phytobezoar S/pexploratory laparotomy, lysis of adhesions, small bowel resection with anastomosis, enterrorhaphy, primary repair of incisional hernia12/31 Dr. Kieth Brightly - POD6 -Still has ileus.  Do not advance diet.  Decrease narcotic dosages.  Tried Dulcolax suppository.  Encourage more  ambulation. Abdominal x-rays ordered to check gas pattern  ID -zosyn 12/31>>1/1, cefotetan perioperative FEN -IVF,clear liquids VTE -SCDs, lovenox  HTN Tobacco abuse  Plan- continue clear liquids due to ileus.  Increase ambulation.  Decrease narcotic and substitute tramadol or toradol. Check abdominal x-ray    @PROBHOSP @  LOS: 6 days    CATERIN TABARES 03/16/2017  . .prob

## 2017-03-17 LAB — BASIC METABOLIC PANEL
Anion gap: 10 (ref 5–15)
BUN: <5 mg/dL — ABNORMAL LOW (ref 6–20)
CO2: 25 mmol/L (ref 22–32)
CREATININE: 0.49 mg/dL (ref 0.44–1.00)
Calcium: 9.1 mg/dL (ref 8.9–10.3)
Chloride: 105 mmol/L (ref 101–111)
GFR calc Af Amer: >60 mL/min (ref >60)
Glucose, Bld: 123 mg/dL — ABNORMAL HIGH (ref 65–99)
Potassium: 3.3 mmol/L — ABNORMAL LOW (ref 3.5–5.1)
SODIUM: 140 mmol/L (ref 135–145)

## 2017-03-17 LAB — CBC
HCT: 32.2 % — ABNORMAL LOW (ref 36.0–46.0)
Hemoglobin: 10.1 g/dL — ABNORMAL LOW (ref 12.0–15.0)
MCH: 30.3 pg (ref 26.0–34.0)
MCHC: 31.4 g/dL (ref 30.0–36.0)
MCV: 96.7 fL (ref 78.0–100.0)
PLATELETS: 343 10*3/uL (ref 150–400)
RBC: 3.33 MIL/uL — AB (ref 3.87–5.11)
RDW: 13.6 % (ref 11.5–15.5)
WBC: 9.8 10*3/uL (ref 4.0–10.5)

## 2017-03-17 MED ORDER — BISACODYL 10 MG RE SUPP
10.0000 mg | Freq: Once | RECTAL | Status: AC
  Administered 2017-03-17: 10 mg via RECTAL
  Filled 2017-03-17: qty 1

## 2017-03-17 MED ORDER — METOPROLOL TARTRATE 5 MG/5ML IV SOLN
5.0000 mg | Freq: Four times a day (QID) | INTRAVENOUS | Status: DC | PRN
  Administered 2017-03-18 (×2): 5 mg via INTRAVENOUS
  Filled 2017-03-17 (×2): qty 5

## 2017-03-17 MED ORDER — POTASSIUM CHLORIDE 10 MEQ/100ML IV SOLN
10.0000 meq | INTRAVENOUS | Status: AC
  Administered 2017-03-17 (×3): 10 meq via INTRAVENOUS
  Filled 2017-03-17 (×3): qty 100

## 2017-03-17 MED ORDER — METOCLOPRAMIDE HCL 5 MG/ML IJ SOLN
10.0000 mg | Freq: Four times a day (QID) | INTRAMUSCULAR | Status: DC
  Administered 2017-03-17 – 2017-03-20 (×13): 10 mg via INTRAVENOUS
  Filled 2017-03-17 (×13): qty 2

## 2017-03-17 NOTE — Progress Notes (Signed)
[facility] Progress Note  8 Days Post-Op  Subjective: CC-  Up in chair this morning. Patient reports mild abdominal pain, but she is still complaining of distension and n/v with PO intake. Passing flatus. No BM yesterday. She has been ambulating in the halls multiple times daily. She only took toradol and tylenol for pain yesterday.  Objective: Vital signs in last 24 hours: Temp:  [98.6 F (37 C)-99.9 F (37.7 C)] 98.8 F (37.1 C) (01/07 0511) Pulse Rate:  [90-99] 90 (01/07 0511) Resp:  [18-20] 20 (01/07 0511) BP: (151-166)/(88-95) 166/94 (01/07 0450) SpO2:  [91 %-94 %] 92 % (01/07 0511) Weight:  [220 lb 0.3 oz (99.8 kg)] 220 lb 0.3 oz (99.8 kg) (01/07 0511) Last BM Date: 03/15/17(small)  Intake/Output from previous day: 01/06 0701 - 01/07 0700 In: 1800 [P.O.:175; I.V.:1625] Out: 1025 [Urine:1025] Intake/Output this shift: No intake/output data recorded.  PE: Gen: Alert, NAD, pleasant HEENT: EOM's intact, pupils equal and round Card: RRR Pulm:effort normal, CTAB Abd: Soft, distended,+BS,midlineincision cdi with staples intact and no erythema or drainage Psych: A&Ox3  Lab Results:  Recent Labs    03/15/17 0423 03/17/17 0641  WBC 7.6 9.8  HGB 10.2* 10.1*  HCT 33.5* 32.2*  PLT 301 343   BMET Recent Labs    03/15/17 0423 03/17/17 0641  NA 145 140  K 3.6 3.3*  CL 110 105  CO2 28 25  GLUCOSE 156* 123*  BUN 5* <5*  CREATININE 0.52 0.49  CALCIUM 9.1 9.1   PT/INR No results for input(s): LABPROT, INR in the last 72 hours. CMP     Component Value Date/Time   NA 140 03/17/2017 0641   K 3.3 (L) 03/17/2017 0641   CL 105 03/17/2017 0641   CO2 25 03/17/2017 0641   GLUCOSE 123 (H) 03/17/2017 0641   BUN <5 (L) 03/17/2017 0641   CREATININE 0.49 03/17/2017 0641   CALCIUM 9.1 03/17/2017 0641   PROT 7.8 03/09/2017 1436   ALBUMIN 4.0 03/09/2017 1436   AST 13 (L) 03/09/2017 1436   ALT 17 03/09/2017 1436   ALKPHOS 74 03/09/2017 1436   BILITOT 1.1 03/09/2017 1436   GFRNONAA >60 03/17/2017 0641   GFRAA >60 03/17/2017 0641   Lipase     Component Value Date/Time   LIPASE 25 03/09/2017 1436       Studies/Results: Dg Abd 2 Views  Result Date: 03/16/2017 CLINICAL DATA:  57 year old female with postoperative ileus and abdominal pain EXAM: ABDOMEN - 2 VIEW COMPARISON:  Prior abdominal radiograph 03/13/2017 FINDINGS: The nasogastric tube is been removed. Persisting gaseous distention of loops of small bowel in the mid abdomen. Maximal small bowel diameter is approximately 5.4 cm. Mild dependent atelectasis in both lower lobes. Small right-sided layering pleural effusion. No large pneumothorax scratch then no large free air. IMPRESSION: 1. Persistent ileus primarily affecting small bowel. Maximal small bowel diameter 5.4 cm. 2. Small right-sided pleural effusion with associated right lower lobe atelectasis. Electronically Signed   By: Jacqulynn Cadet M.D.   On: 03/16/2017 14:33    Anti-infectives: Anti-infectives (From admission, onward)   Start     Dose/Rate Route Frequency Ordered Stop   03/10/17 2200  piperacillin-tazobactam (ZOSYN) IVPB 3.375 g  Status:  Discontinued     3.375 g 12.5 mL/hr over 240 Minutes Intravenous Every 8 hours 03/10/17 2125 03/11/17 0805   03/09/17 2315  cefoTEtan (CEFOTAN) 2 g in dextrose 5 % 50 mL IVPB     2 g 100 mL/hr over 30 Minutes  Intravenous  Once 03/09/17 2311 03/09/17 2351       Assessment/Plan HTN Tobacco abuse  Incarcerated hernia, bowel obstruction with phytobezoar S/pexploratory laparotomy, lysis of adhesions, small bowel resection with anastomosis, enterrorhaphy, primary repair of incisional hernia12/31 Dr. Kieth Brightly - POD7 - passing flatus, ileus persists - WBC9.8, TMAX 99.9  ID -zosyn 12/31>>1/1, cefotetan perioperative FEN -IVF, clear liquids VTE -SCDs, lovenox  Plan- Dulcolax suppository today. Continue ambulating, IS. Clears as tolerated. Replace  potassium. Check labs in AM. If unable to advance diet soon need to consider TPN.   LOS: 7 days    Wellington Hampshire , [facility] 03/17/2017, 8:48 AM Pager: [phone] Consults: [phone] Mon-Fri 7:00 am-4:30 pm Sat-Sun 7:00 am-11:30 am

## 2017-03-18 LAB — CBC
HCT: 32.9 % — ABNORMAL LOW (ref 36.0–46.0)
HEMOGLOBIN: 10 g/dL — AB (ref 12.0–15.0)
MCH: 29.4 pg (ref 26.0–34.0)
MCHC: 30.4 g/dL (ref 30.0–36.0)
MCV: 96.8 fL (ref 78.0–100.0)
Platelets: 372 10*3/uL (ref 150–400)
RBC: 3.4 MIL/uL — ABNORMAL LOW (ref 3.87–5.11)
RDW: 13.7 % (ref 11.5–15.5)
WBC: 10.1 10*3/uL (ref 4.0–10.5)

## 2017-03-18 LAB — BASIC METABOLIC PANEL
ANION GAP: 9 (ref 5–15)
BUN: <5 mg/dL — ABNORMAL LOW (ref 6–20)
CHLORIDE: 104 mmol/L (ref 101–111)
CO2: 26 mmol/L (ref 22–32)
Calcium: 9.3 mg/dL (ref 8.9–10.3)
Creatinine, Ser: 0.46 mg/dL (ref 0.44–1.00)
GFR calc non Af Amer: >60 mL/min (ref >60)
GLUCOSE: 90 mg/dL (ref 65–99)
Potassium: 3.3 mmol/L — ABNORMAL LOW (ref 3.5–5.1)
Sodium: 139 mmol/L (ref 135–145)

## 2017-03-18 LAB — PHOSPHORUS: PHOSPHORUS: 3 mg/dL (ref 2.5–4.6)

## 2017-03-18 LAB — MAGNESIUM: Magnesium: 1.4 mg/dL — ABNORMAL LOW (ref 1.7–2.4)

## 2017-03-18 MED ORDER — MAGNESIUM CHLORIDE 64 MG PO TBEC
1.0000 | DELAYED_RELEASE_TABLET | Freq: Two times a day (BID) | ORAL | Status: AC
  Administered 2017-03-18 (×2): 64 mg via ORAL
  Filled 2017-03-18 (×2): qty 1

## 2017-03-18 MED ORDER — HYDROCHLOROTHIAZIDE 25 MG PO TABS
25.0000 mg | ORAL_TABLET | Freq: Every day | ORAL | Status: DC
  Administered 2017-03-18 – 2017-03-20 (×3): 25 mg via ORAL
  Filled 2017-03-18 (×3): qty 1

## 2017-03-18 MED ORDER — POTASSIUM CHLORIDE CRYS ER 20 MEQ PO TBCR
20.0000 meq | EXTENDED_RELEASE_TABLET | Freq: Two times a day (BID) | ORAL | Status: DC
  Administered 2017-03-18 – 2017-03-20 (×5): 20 meq via ORAL
  Filled 2017-03-18 (×5): qty 1

## 2017-03-18 MED ORDER — LOSARTAN POTASSIUM-HCTZ 100-25 MG PO TABS: 1.0000 | ORAL_TABLET | Freq: Every day | ORAL | Status: DC

## 2017-03-18 MED ORDER — LOSARTAN POTASSIUM 50 MG PO TABS
100.0000 mg | ORAL_TABLET | Freq: Every day | ORAL | Status: DC
  Administered 2017-03-18 – 2017-03-20 (×3): 100 mg via ORAL
  Filled 2017-03-18 (×3): qty 2

## 2017-03-18 NOTE — Progress Notes (Signed)
[facility] Progress Note  9 Days Post-Op  Subjective: CC-  Patient states that she had a good BM yesterday and a small one yesterday. Passing flatus. Has not had any n/v in 24 hours.  Objective: Vital signs in last 24 hours: Temp:  [98.6 F (37 C)-99.1 F (37.3 C)] 98.6 F (37 C) (01/08 0519) Pulse Rate:  [90-99] 90 (01/08 0519) Resp:  [18-20] 18 (01/08 0519) BP: (151-166)/(92-93) 166/93 (01/08 0519) SpO2:  [94 %-97 %] 94 % (01/08 0519) Last BM Date: 03/17/17  Intake/Output from previous day: 01/07 0701 - 01/08 0700 In: 800 [I.V.:800] Out: 300 [Urine:300] Intake/Output this shift: No intake/output data recorded.  PE: Gen: Alert, NAD, pleasant HEENT: EOM's intact, pupils equal and round Card:RRR Pulm:effort normal, CTAB Abd: Soft, distended (less so than yesterday),nontender, +BS,midlineincision cdi with staples intact and no erythema or drainage Psych: A&Ox3   Lab Results:  Recent Labs    03/17/17 0641  WBC 9.8  HGB 10.1*  HCT 32.2*  PLT 343   BMET Recent Labs    03/17/17 0641  NA 140  K 3.3*  CL 105  CO2 25  GLUCOSE 123*  BUN <5*  CREATININE 0.49  CALCIUM 9.1   PT/INR No results for input(s): LABPROT, INR in the last 72 hours. CMP     Component Value Date/Time   NA 140 03/17/2017 0641   K 3.3 (L) 03/17/2017 0641   CL 105 03/17/2017 0641   CO2 25 03/17/2017 0641   GLUCOSE 123 (H) 03/17/2017 0641   BUN <5 (L) 03/17/2017 0641   CREATININE 0.49 03/17/2017 0641   CALCIUM 9.1 03/17/2017 0641   PROT 7.8 03/09/2017 1436   ALBUMIN 4.0 03/09/2017 1436   AST 13 (L) 03/09/2017 1436   ALT 17 03/09/2017 1436   ALKPHOS 74 03/09/2017 1436   BILITOT 1.1 03/09/2017 1436   GFRNONAA >60 03/17/2017 0641   GFRAA >60 03/17/2017 0641   Lipase     Component Value Date/Time   LIPASE 25 03/09/2017 1436       Studies/Results: Dg Abd 2 Views  Result Date: 03/16/2017 CLINICAL DATA:  56 year old female with postoperative ileus and  abdominal pain EXAM: ABDOMEN - 2 VIEW COMPARISON:  Prior abdominal radiograph 03/13/2017 FINDINGS: The nasogastric tube is been removed. Persisting gaseous distention of loops of small bowel in the mid abdomen. Maximal small bowel diameter is approximately 5.4 cm. Mild dependent atelectasis in both lower lobes. Small right-sided layering pleural effusion. No large pneumothorax scratch then no large free air. IMPRESSION: 1. Persistent ileus primarily affecting small bowel. Maximal small bowel diameter 5.4 cm. 2. Small right-sided pleural effusion with associated right lower lobe atelectasis. Electronically Signed   By: Jacqulynn Cadet M.D.   On: 03/16/2017 14:33    Anti-infectives: Anti-infectives (From admission, onward)   Start     Dose/Rate Route Frequency Ordered Stop   03/10/17 2200  piperacillin-tazobactam (ZOSYN) IVPB 3.375 g  Status:  Discontinued     3.375 g 12.5 mL/hr over 240 Minutes Intravenous Every 8 hours 03/10/17 2125 03/11/17 0805   03/09/17 2315  cefoTEtan (CEFOTAN) 2 g in dextrose 5 % 50 mL IVPB     2 g 100 mL/hr over 30 Minutes Intravenous  Once 03/09/17 2311 03/09/17 2351       Assessment/Plan HTN - restart home meds Tobacco abuse  Incarcerated hernia, bowel obstruction with phytobezoar S/pexploratory laparotomy, lysis of adhesions, small bowel resection with anastomosis, enterrorhaphy, primary repair of incisional hernia12/31 Dr. Kieth Brightly - POD8 -  BM yesterday  ID -zosyn 12/31>>1/1, cefotetan perioperative FEN -IVF, full liquids VTE -SCDs, lovenox  Plan-Labs pending. Advance to full liquids. Restart home HTN medications. Continue reglan. Continue ambulating.   LOS: 8 days    Wellington Hampshire , [facility] 03/18/2017, 8:47 AM Pager: [phone] Consults: [phone] Mon-Fri 7:00 am-4:30 pm Sat-Sun 7:00 am-11:30 am

## 2017-03-19 MED ORDER — HEPARIN SODIUM (PORCINE) 1000 UNIT/ML IJ SOLN
INTRAMUSCULAR | Status: AC
  Filled 2017-03-19: qty 1

## 2017-03-19 MED ORDER — IOPAMIDOL (ISOVUE-370) INJECTION 76%
INTRAVENOUS | Status: AC
  Filled 2017-03-19: qty 100

## 2017-03-19 MED ORDER — MIDAZOLAM HCL 2 MG/2ML IJ SOLN
INTRAMUSCULAR | Status: AC
  Filled 2017-03-19: qty 2

## 2017-03-19 MED ORDER — HEPARIN (PORCINE) IN NACL 2-0.9 UNIT/ML-% IJ SOLN
INTRAMUSCULAR | Status: AC
  Filled 2017-03-19: qty 1000

## 2017-03-19 MED ORDER — LABETALOL HCL 5 MG/ML IV SOLN
INTRAVENOUS | Status: AC
  Filled 2017-03-19: qty 4

## 2017-03-19 MED ORDER — NITROGLYCERIN IN D5W 200-5 MCG/ML-% IV SOLN
INTRAVENOUS | Status: AC
  Filled 2017-03-19: qty 250

## 2017-03-19 MED ORDER — ENSURE ENLIVE PO LIQD
237.0000 mL | Freq: Two times a day (BID) | ORAL | Status: DC
  Administered 2017-03-19 (×2): 237 mL via ORAL

## 2017-03-19 MED ORDER — VERAPAMIL HCL 2.5 MG/ML IV SOLN
INTRAVENOUS | Status: AC
  Filled 2017-03-19: qty 2

## 2017-03-19 MED ORDER — LIDOCAINE HCL (PF) 1 % IJ SOLN
INTRAMUSCULAR | Status: AC
  Filled 2017-03-19: qty 30

## 2017-03-19 MED ORDER — NITROGLYCERIN 1 MG/10 ML FOR IR/CATH LAB
INTRA_ARTERIAL | Status: AC
  Filled 2017-03-19: qty 10

## 2017-03-19 MED ORDER — FENTANYL CITRATE (PF) 100 MCG/2ML IJ SOLN
INTRAMUSCULAR | Status: AC
  Filled 2017-03-19: qty 2

## 2017-03-19 NOTE — Discharge Instructions (Signed)
[facility]      [facility] [phone]  OPEN ABDOMINAL SURGERY: POST OP INSTRUCTIONS  Always review your discharge instruction sheet given to you by the facility where your surgery was performed.  IF YOU HAVE DISABILITY OR FAMILY LEAVE FORMS, YOU MUST BRING THEM TO THE OFFICE FOR PROCESSING.  PLEASE DO NOT GIVE THEM TO YOUR DOCTOR.  1. A prescription for pain medication may be given to you upon discharge.  Take your pain medication as prescribed, if needed.  If narcotic pain medicine is not needed, then you may take acetaminophen (Tylenol) or ibuprofen (Advil) as needed. 2. Take your usually prescribed medications unless otherwise directed. 3. If you need a refill on your pain medication, please contact your pharmacy. They will contact our office to request authorization.  Prescriptions will not be filled after 5pm or on week-ends. 4. You should follow a light diet the first few days after arrival home, such as soup and crackers, pudding, etc.unless your doctor has advised otherwise. A high-fiber, low fat diet can be resumed as tolerated.   Be sure to include lots of fluids daily. Most patients will experience some swelling and bruising on the chest and neck area.  Ice packs will help.  Swelling and bruising can take several days to resolve 5. Most patients will experience some swelling and bruising in the area of the incision. Ice pack will help. Swelling and bruising can take several days to resolve..  6. It is common to experience some constipation if taking pain medication after surgery.  Increasing fluid intake and taking a stool softener will usually help or prevent this problem from occurring.  A mild laxative (Milk of Magnesia or Miralax) should be taken according to package directions if there are no bowel movements after 48 hours. 7.  You may have steri-strips (small skin tapes) in place directly over the incision.  These strips should be left on the skin for 7-10 days.  If your  surgeon used skin glue on the incision, you may shower in 24 hours.  The glue will flake off over the next 2-3 weeks.  Any sutures or staples will be removed at the office during your follow-up visit. You may find that a light gauze bandage over your incision may keep your staples from being rubbed or pulled. You may shower and replace the bandage daily. 8. ACTIVITIES:  You may resume regular (light) daily activities beginning the next day--such as daily self-care, walking, climbing stairs--gradually increasing activities as tolerated.  You may have sexual intercourse when it is comfortable.  Refrain from any heavy lifting or straining until approved by your doctor. a. You may drive when you no longer are taking prescription pain medication, you can comfortably wear a seatbelt, and you can safely maneuver your car and apply brakes b. Return to Work: ___________________________________ 9. You should see your doctor in the office for a follow-up appointment approximately two weeks after your surgery.  Make sure that you call for this appointment within a day or two after you arrive home to insure a convenient appointment time. OTHER INSTRUCTIONS:  _____________________________________________________________ _____________________________________________________________  WHEN TO CALL YOUR DOCTOR: 1. Fever over 101.0 2. Inability to urinate 3. Nausea and/or vomiting 4. Extreme swelling or bruising 5. Continued bleeding from incision. 6. Increased pain, redness, or drainage from the incision. 7. Difficulty swallowing or breathing 8. Muscle cramping or spasms. 9. Numbness or tingling in hands or feet or around lips.  The clinic staff is available to   answer your questions during regular business hours.  Please don't hesitate to call and ask to speak to one of the nurses if you have concerns.  For further questions, please visit [email or web address]   

## 2017-03-19 NOTE — Progress Notes (Signed)
  [facility] Progress Note  10 Days Post-Op  Subjective: CC-  Feeling well this morning. Tolerating full liquids. Denies n/v. Had 2 more good BMs yesterday. Passing flatus this morning.  Objective: Vital signs in last 24 hours: Temp:  [98.2 F (36.8 C)-99.2 F (37.3 C)] 98.2 F (36.8 C) (01/09 0503) Pulse Rate:  [79-87] 83 (01/09 0503) Resp:  [18] 18 (01/09 0503) BP: (155-174)/(90-98) 158/98 (01/09 0503) SpO2:  [95 %-98 %] 98 % (01/09 0503) Last BM Date: 03/18/17  Intake/Output from previous day: 01/08 0701 - 01/09 0700 In: 2490 [P.O.:720; I.V.:1770] Out: 1400 [Urine:1400] Intake/Output this shift: No intake/output data recorded.  PE: Gen: Alert, NAD, pleasant HEENT: EOM's intact, pupils equal and round Card:RRR Pulm:effort normal, CTAB Abd: Soft, mild distension,nontender, +BS,midlineincisioncdi with staples intact and no erythema or drainage Psych: A&Ox3  Lab Results:  Recent Labs    03/17/17 0641 03/18/17 0814  WBC 9.8 10.1  HGB 10.1* 10.0*  HCT 32.2* 32.9*  PLT 343 372   BMET Recent Labs    03/17/17 0641 03/18/17 0814  NA 140 139  K 3.3* 3.3*  CL 105 104  CO2 25 26  GLUCOSE 123* 90  BUN <5* <5*  CREATININE 0.49 0.46  CALCIUM 9.1 9.3   PT/INR No results for input(s): LABPROT, INR in the last 72 hours. CMP     Component Value Date/Time   NA 139 03/18/2017 0814   K 3.3 (L) 03/18/2017 0814   CL 104 03/18/2017 0814   CO2 26 03/18/2017 0814   GLUCOSE 90 03/18/2017 0814   BUN <5 (L) 03/18/2017 0814   CREATININE 0.46 03/18/2017 0814   CALCIUM 9.3 03/18/2017 0814   PROT 7.8 03/09/2017 1436   ALBUMIN 4.0 03/09/2017 1436   AST 13 (L) 03/09/2017 1436   ALT 17 03/09/2017 1436   ALKPHOS 74 03/09/2017 1436   BILITOT 1.1 03/09/2017 1436   GFRNONAA >60 03/18/2017 0814   GFRAA >60 03/18/2017 0814   Lipase     Component Value Date/Time   LIPASE 25 03/09/2017 1436       Studies/Results: No results  found.  Anti-infectives: Anti-infectives (From admission, onward)   Start     Dose/Rate Route Frequency Ordered Stop   03/10/17 2200  piperacillin-tazobactam (ZOSYN) IVPB 3.375 g  Status:  Discontinued     3.375 g 12.5 mL/hr over 240 Minutes Intravenous Every 8 hours 03/10/17 2125 03/11/17 0805   03/09/17 2315  cefoTEtan (CEFOTAN) 2 g in dextrose 5 % 50 mL IVPB     2 g 100 mL/hr over 30 Minutes Intravenous  Once 03/09/17 2311 03/09/17 2351       Assessment/Plan HTN - home meds Tobacco abuse  Incarcerated hernia, bowel obstruction with phytobezoar S/pexploratory laparotomy, lysis of adhesions, small bowel resection with anastomosis, enterrorhaphy, primary repair of incisional hernia12/31 Dr. Kieth Brightly - POD8 - bowel function returning  ID -zosyn 12/31>>1/1, cefotetan perioperative FEN -KVO IVF, soft diet VTE -SCDs, lovenox Follow up - Dr. Kieth Brightly  Plan-Advance to soft diet. Continue ambulating. Possible discharge tomorrow. Patient will follow up for staple removal and with Dr. Kieth Brightly after discharge.   LOS: 9 days    Wellington Hampshire , [facility] 03/19/2017, 8:21 AM Pager: [phone] Consults: [phone] Mon-Fri 7:00 am-4:30 pm Sat-Sun 7:00 am-11:30 am

## 2017-03-20 MED ORDER — HYDROCODONE-ACETAMINOPHEN 5-325 MG PO TABS: 1.0000 | ORAL_TABLET | Freq: Four times a day (QID) | ORAL | 0 refills | Status: DC | PRN

## 2017-03-20 MED ORDER — BOOST / RESOURCE BREEZE PO LIQD CUSTOM
1.0000 | Freq: Two times a day (BID) | ORAL | Status: DC
  Administered 2017-03-20: 1 via ORAL

## 2017-03-20 NOTE — Care Management Note (Signed)
Case Management Note  Patient Details  Name: Kristi Russell MRN: [id number] Date of Birth: January 28, 1961  Subjective/Objective:                    Action/Plan: [place] letter given and explained . Patient active with [facility]   Expected Discharge Date:  03/20/17               Expected Discharge Plan:  Home/Self Care  In-House Referral:     Discharge planning Services  CM Consult, [facility], [facility],The Program, Medication Assistance  Post Acute Care Choice:  NA Choice offered to:  Patient  DME Arranged:  N/A DME Agency:  NA  HH Arranged:  NA HH Agency:  NA  Status of Service:  Completed, signed off  If discussed at [place] of Stay Meetings, dates discussed:    Additional Comments:  Marilu Favre, RN 03/20/2017, 9:50 AM

## 2017-03-20 NOTE — Discharge Summary (Signed)
[facility] Discharge Summary   Patient ID: Kristi Russell MRN: [id number] DOB/AGE: 10-31-1960 57 y.o.  Admit date: 03/09/2017 Discharge date: 03/20/2017  Admitting Diagnosis: Incisional hernia Bowel obstruction  Discharge Diagnosis Patient Active Problem List   Diagnosis Date Noted  . Small bowel obstruction ([place]) 03/10/2017  . 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 Retinal detachment 01/12/2016  . Hypertension     Consultants None  Imaging: No results found.  Procedures Dr. Kieth Brightly (03/10/17) -  exploratory laparotomy, lysis of adhesions, small bowel resection with anastomosis, enterrorhaphy, primary repair of incisional hernia  Hospital Course:  Kristi Russell is a 57yo female who presented to [facility] 12/30 with acute onset abdominal pain, nausea, and vomiting.  Workup showed leukocytosis, tachycardia and CT findings concerning for strangulated intestines in an incisional hernia and possible bowel obstruction.  Patient was admitted and underwent procedure listed above. Intraoperatively she was found to have an incarcerated hernia, bowel obstruction with phytobezoar. Tolerated procedure well and was transferred to the floor.  Patient did have a prolonged ileus postoperatively, but this gradually improved with time. Diet was advanced as tolerated.  On POD10 the patient was voiding well, tolerating diet, ambulating well, pain well controlled, vital signs stable, incisions c/d/i and felt stable for discharge home.  Patient will follow up in our office next week for staple removal and then with her surgeon. She knows to call with questions or concerns.    I have personally reviewed the patients medication history on the [place] controlled substance database.    Physical Exam: Gen: Alert, NAD, pleasant HEENT: EOM's intact, pupils equal and round Card:RRR Pulm:effort normal, CTAB Abd: Soft, mild distension,nontender,+BS,midlineincisioncdi with staples intact  and no erythema or drainage Psych: A&Ox3   Allergies as of 03/20/2017   No Known Allergies     Medication List    STOP taking these medications   ibuprofen 600 MG tablet Commonly known as:  ADVIL,MOTRIN     TAKE these medications   artificial tears Oint ophthalmic ointment Commonly known as:  LACRILUBE Place ribbon in left eye and patch closed before bedtime daily   cetirizine 10 MG tablet Commonly known as:  ZYRTEC Take 1 tablet (10 mg total) by mouth daily.   HYDROcodone-acetaminophen 5-325 MG tablet Commonly known as:  NORCO/VICODIN Take 1 tablet by mouth every 6 (six) hours as needed for severe pain.   losartan-hydrochlorothiazide 100-25 MG tablet Commonly known as:  HYZAAR 1 tab by mouth daily   mometasone 50 MCG/ACT nasal spray Commonly known as:  NASONEX 2 sprays each nostril daily   varenicline 0.5 MG X 11 & 1 MG X 42 tablet Commonly known as:  CHANTIX STARTING MONTH PAK Take one 0.5 mg tablet by mouth once daily for 3 days, then increase to one 0.5 mg tablet twice daily for 4 days, then increase to one 1 mg tablet twice daily.   varenicline 1 MG tablet Commonly known as:  CHANTIX CONTINUING MONTH PAK Take 1 tablet (1 mg total) by mouth 2 (two) times daily.        Follow-up Information    Kinsinger, Arta Bruce, MD. Go on 04/04/2017.   Specialty:  General Surgery Why:  Your appointment is 04/04/17 at 12PM. Please arrive 15 minutes prior to your appointment to check in. Contact information: [address] [place] [address] [phone]        [facility], [place]. Go on 03/24/2017.   Specialty:  General Surgery Why:  Your appointment  is 03/24/17 at 10AM with one of our nurses to have your staples removed. Please arrive 30 minutes prior to your appointment to check in and fill out paperwork. Bring photo ID and insurance information. Contact information: [place] [phone]       Mack Hook, MD. Call in 1 week(s).   Specialty:  Internal Medicine Why:  Call to make a follow up regarding blood pressure management Contact information: [place] [address] [phone]           Signed: Wellington Hampshire, [facility] 03/20/2017, 8:09 AM Pager: [phone] Consults: [phone] Mon-Fri 7:00 am-4:30 pm Sat-Sun 7:00 am-11:30 am

## 2017-03-20 NOTE — Progress Notes (Signed)
Discharged home today . Personal belongings,Discharged instructions and prescription given . Advised to call MD for any problems, questions or concerns. verbalized understanding of instructions.

## 2017-04-04 ENCOUNTER — Ambulatory Visit: Payer: Self-pay | Admitting: Internal Medicine

## 2017-04-04 ENCOUNTER — Encounter: Payer: Self-pay | Admitting: Internal Medicine

## 2017-04-04 VITALS — BP 162/110 | HR 60 | Resp 12 | Ht 63.0 in | Wt 190.0 lb

## 2017-04-04 DIAGNOSIS — Z9109 Other allergy status, other than to drugs and biological substances: Secondary | ICD-10-CM

## 2017-04-04 DIAGNOSIS — R059 Cough, unspecified: Secondary | ICD-10-CM

## 2017-04-04 DIAGNOSIS — Z13220 Encounter for screening for lipoid disorders: Secondary | ICD-10-CM

## 2017-04-04 DIAGNOSIS — H04122 Dry eye syndrome of left lacrimal gland: Secondary | ICD-10-CM

## 2017-04-04 DIAGNOSIS — Z79899 Other long term (current) drug therapy: Secondary | ICD-10-CM

## 2017-04-04 DIAGNOSIS — R05 Cough: Secondary | ICD-10-CM

## 2017-04-04 DIAGNOSIS — Z659 Problem related to unspecified psychosocial circumstances: Secondary | ICD-10-CM

## 2017-04-04 DIAGNOSIS — I10 Essential (primary) hypertension: Secondary | ICD-10-CM

## 2017-04-04 DIAGNOSIS — Z716 Tobacco abuse counseling: Secondary | ICD-10-CM

## 2017-04-04 NOTE — Progress Notes (Signed)
   Subjective:    Patient ID: Kristi Russell, female    DOB: 04/21/1960, 57 y.o.   MRN: [id number]  HPI   1.  Cough:  Did get Zyrtec.  Was hospitalized for SBO, for which she underwent surgery. Stopped coughing at some point.  She feels she was getting better on the Zyrtec before hospitalization.  Has not obtained mattress or pillow covers.   No nasonex Did not go in for Chantix. Smoking 2 cigarettes every other day or so.  2.  SBO:  December 30th:   Incarcerated incisional hernia, multiple adhesions requiring lysis with findings of previous anastomosis of small bowel and of obstructive phytobezoar present at previous anastomosis site. She reportedly had "a complication with her intestines during a c section" in the past.  She does not have much of a memory of what happened.  3.  Essential Hypertension:  Has not picked up Losartan/HCTZ since discharged from hospital.  Having difficulties with transportation.  Current Meds  Medication Sig  . cetirizine (ZYRTEC) 10 MG tablet Take 1 tablet (10 mg total) by mouth daily.   No Known Allergies   Review of Systems     Objective:   Physical Exam NAD HEENT:  PERRL, EOMI, Left eyelids weak. TMs pearly gray, throat without injection. Neck:  Supple, No adenopathy Chest:  CTA CV:  RRR without murmur or rub.  Radial and DP pulses normal and equal Abd:  S, NT.  Surgical scars healing nicely.  No HSM or mass, + BS       Assessment & Plan:  1.  Cough:  Resolved. To continue with Zyrtec.  Encouraged environmental control of dust/mites. Encouraged stopping smoking.  2.  Tobacco cessation:  Again discussed avenues to discontinue.  She does have the Chantix available previously sent to [facility] at [place].  3.  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  4.  Essential Hypertension:  Encouraged her to pick up Losartan/HCTZ from [facility].  Difficulties  getting to [place].  Not clear she can get to any pharmacy easily.  5.  Social issues/lack of transportation/stress:  Referral to Macie Burows, LCSW to look into avenues to overcome barriers and possibly counseling for dealing with her related stressors.  6.  HM:  Check FLP  7.  Recent hospitalization for SBO with major abdominal surgery:  Follow up CBC and CMP.  Return for CPE--sending for records from [place] and [place].

## 2017-04-05 LAB — COMPREHENSIVE METABOLIC PANEL
A/G RATIO: 1.4 (ref 1.2–2.2)
ALT: 15 IU/L (ref 0–32)
AST: 13 IU/L (ref 0–40)
Albumin: 3.8 g/dL (ref 3.5–5.5)
Alkaline Phosphatase: 56 IU/L (ref 39–117)
BILIRUBIN TOTAL: 0.5 mg/dL (ref 0.0–1.2)
BUN/Creatinine Ratio: 5 — ABNORMAL LOW (ref 9–23)
BUN: 3 mg/dL — ABNORMAL LOW (ref 6–24)
CALCIUM: 10.9 mg/dL — AB (ref 8.7–10.2)
CHLORIDE: 106 mmol/L (ref 96–106)
CO2: 27 mmol/L (ref 20–29)
Creatinine, Ser: 0.55 mg/dL — ABNORMAL LOW (ref 0.57–1.00)
GFR, EST AFRICAN AMERICAN: 120 mL/min/{1.73_m2} (ref >59)
GFR, EST NON AFRICAN AMERICAN: 104 mL/min/{1.73_m2} (ref >59)
GLOBULIN, TOTAL: 2.8 g/dL (ref 1.5–4.5)
Glucose: 145 mg/dL — ABNORMAL HIGH (ref 65–99)
POTASSIUM: 4.1 mmol/L (ref 3.5–5.2)
SODIUM: 144 mmol/L (ref 134–144)
Total Protein: 6.6 g/dL (ref 6.0–8.5)

## 2017-04-05 LAB — CBC WITH DIFFERENTIAL/PLATELET
BASOS ABS: 0 10*3/uL (ref 0.0–0.2)
Basos: 1 %
EOS (ABSOLUTE): 0.2 10*3/uL (ref 0.0–0.4)
Eos: 3 %
HEMOGLOBIN: 11.8 g/dL (ref 11.1–15.9)
Hematocrit: 36.9 % (ref 34.0–46.6)
IMMATURE GRANS (ABS): 0 10*3/uL (ref 0.0–0.1)
IMMATURE GRANULOCYTES: 0 %
LYMPHS: 34 %
Lymphocytes Absolute: 2 10*3/uL (ref 0.7–3.1)
MCH: 30.6 pg (ref 26.6–33.0)
MCHC: 32 g/dL (ref 31.5–35.7)
MCV: 96 fL (ref 79–97)
MONOCYTES: 6 %
Monocytes Absolute: 0.4 10*3/uL (ref 0.1–0.9)
NEUTROS ABS: 3.4 10*3/uL (ref 1.4–7.0)
NEUTROS PCT: 56 %
PLATELETS: 358 10*3/uL (ref 150–379)
RBC: 3.86 x10E6/uL (ref 3.77–5.28)
RDW: 14.4 % (ref 12.3–15.4)
WBC: 6 10*3/uL (ref 3.4–10.8)

## 2017-04-05 LAB — LIPID PANEL W/O CHOL/HDL RATIO
Cholesterol, Total: 129 mg/dL (ref 100–199)
HDL: 36 mg/dL — ABNORMAL LOW (ref >39)
LDL Calculated: 69 mg/dL (ref 0–99)
TRIGLYCERIDES: 120 mg/dL (ref 0–149)
VLDL CHOLESTEROL CAL: 24 mg/dL (ref 5–40)

## 2017-05-12 ENCOUNTER — Other Ambulatory Visit: Payer: Self-pay

## 2017-07-10 ENCOUNTER — Encounter: Payer: Self-pay | Admitting: Internal Medicine

## 2017-07-10 ENCOUNTER — Ambulatory Visit: Payer: Self-pay | Admitting: Internal Medicine

## 2017-07-10 VITALS — BP 138/82 | HR 90 | Resp 12 | Ht 63.0 in | Wt 193.0 lb

## 2017-07-10 DIAGNOSIS — I10 Essential (primary) hypertension: Secondary | ICD-10-CM

## 2017-07-10 DIAGNOSIS — M7552 Bursitis of left shoulder: Secondary | ICD-10-CM

## 2017-07-10 MED ORDER — LOSARTAN POTASSIUM-HCTZ 50-12.5 MG PO TABS: ORAL_TABLET | ORAL | 1 refills | Status: DC

## 2017-07-10 MED ORDER — METHYLPREDNISOLONE ACETATE 40 MG/ML IJ SUSP
40.0000 mg | Freq: Once | INTRAMUSCULAR | Status: AC
  Administered 2017-07-10: 40 mg via INTRA_ARTICULAR

## 2017-07-10 NOTE — Progress Notes (Signed)
   Subjective:    Patient ID: Kristi Russell, female    DOB: Jul 03, 1960, 57 y.o.   MRN: [id number]  HPI   1.  Essential Hypertension: Ran out of Losartan/HCTZ 100/25 mg daily about 1 week ago.  Did not have current orange card.    2.  Left shoulder and arm pain:  Problem for 2 months.  Was carrying a bag on her shoulder regularly before.  Unable to lift shoulder above 90 degrees.  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   Has tried Ibuprofen 400 mg without improvement.  No outpatient medications have been marked as taking for the 07/10/17 encounter (Office Visit) with Mack Hook, MD.   No Known Allergies   Review of Systems     Objective:   Physical Exam NAD Lungs:  CTA CV:  RRR without murmur or rub, Radial pulses normal and equal Left shoulder: unable to flex or abduct (more so the latter) above 90 degrees.  Some ability to externally rotate, but limited internal rotation.  Unable to resist downward force on abducted internally rotated shoulder/arm. Tender over [facility] joint and subacromial bursa area.    Obtained informed consent for a left subacromial injection.   Area cleaned in sterile fashion and 40 mg plus ml of  5 ml1% lidocaine injected, the latter as patient did not feel good local anesthetic  Patient tolerated well without complication.      Assessment & Plan:  1.  Essential Hypertension:  BP okay.  Temporary Rx to [facility] for half dose of Losartan/HCTZ to take 2 daily.  2.  Rotator cuff tendinitis/subacromial bursitis. Injected with 40 mg Depo Medrol.  Referral to [facility].

## 2017-07-11 NOTE — Progress Notes (Signed)
Referral, demographics and OV notes faxed to [facility]. Facility will contact patient to schedule appointment.

## 2017-07-15 ENCOUNTER — Other Ambulatory Visit: Payer: Self-pay | Admitting: Internal Medicine

## 2017-07-15 ENCOUNTER — Telehealth: Payer: Self-pay | Admitting: Internal Medicine

## 2017-07-15 MED ORDER — LOSARTAN POTASSIUM-HCTZ 100-25 MG PO TABS: ORAL_TABLET | ORAL | 6 refills | Status: DC

## 2017-07-15 NOTE — Telephone Encounter (Signed)
The Losartan/HCTZ [facility] had was recalled and the new supplier charges much more.

## 2017-07-15 NOTE — Telephone Encounter (Signed)
Patient called stating was told by Dr. Amil Amen Losartan will cost 10.00 dollars or so and when she went to [facility] she was told the cost was $50.00. Patient waiting for call back and clarification.

## 2017-07-15 NOTE — Telephone Encounter (Signed)
Called patient [facility] to find a GoodRx coupon for $6.44 for 30 tabs of her strength. She can pick up coupon tomorrow and take to [facility].

## 2017-07-15 NOTE — Telephone Encounter (Signed)
No longer on $4 list at [facility] due to recall.  Kristi Russell has formulation not part of recall. Will honor Good RX coupon

## 2017-07-16 NOTE — Telephone Encounter (Signed)
GoodRx coupon for $6.44 is being picked up by patient.

## 2017-07-28 ENCOUNTER — Ambulatory Visit: Payer: Self-pay | Admitting: Internal Medicine

## 2017-08-05 ENCOUNTER — Encounter: Payer: Self-pay | Admitting: Internal Medicine

## 2017-09-10 DIAGNOSIS — M7502 Adhesive capsulitis of left shoulder: Secondary | ICD-10-CM | POA: Insufficient documentation

## 2017-09-10 DIAGNOSIS — E6609 Other obesity due to excess calories: Secondary | ICD-10-CM | POA: Insufficient documentation

## 2017-09-10 DIAGNOSIS — Z6834 Body mass index (BMI) 34.0-34.9, adult: Secondary | ICD-10-CM

## 2017-09-10 DIAGNOSIS — K08129 Complete loss of teeth due to periodontal diseases, unspecified class: Secondary | ICD-10-CM | POA: Insufficient documentation

## 2017-10-10 ENCOUNTER — Ambulatory Visit: Payer: Self-pay | Admitting: Internal Medicine

## 2017-10-15 ENCOUNTER — Ambulatory Visit: Payer: Self-pay | Admitting: Internal Medicine

## 2018-02-20 ENCOUNTER — Encounter (HOSPITAL_COMMUNITY): Payer: Self-pay

## 2018-02-20 ENCOUNTER — Emergency Department (HOSPITAL_COMMUNITY)
Admission: EM | Admit: 2018-02-20 | Discharge: 2018-02-20 | Disposition: A | Discharge status: Home / Self Care | Payer: No Typology Code available for payment source | Attending: Emergency Medicine | Admitting: Emergency Medicine

## 2018-02-20 ENCOUNTER — Other Ambulatory Visit: Payer: Self-pay

## 2018-02-20 DIAGNOSIS — Z79899 Other long term (current) drug therapy: Secondary | ICD-10-CM | POA: Insufficient documentation

## 2018-02-20 DIAGNOSIS — K047 Periapical abscess without sinus: Secondary | ICD-10-CM | POA: Insufficient documentation

## 2018-02-20 DIAGNOSIS — K029 Dental caries, unspecified: Secondary | ICD-10-CM | POA: Insufficient documentation

## 2018-02-20 DIAGNOSIS — I10 Essential (primary) hypertension: Secondary | ICD-10-CM | POA: Insufficient documentation

## 2018-02-20 DIAGNOSIS — F17210 Nicotine dependence, cigarettes, uncomplicated: Secondary | ICD-10-CM | POA: Insufficient documentation

## 2018-02-20 MED ORDER — CLINDAMYCIN HCL 300 MG PO CAPS: 300.0000 mg | ORAL_CAPSULE | Freq: Four times a day (QID) | ORAL | 0 refills | Status: DC

## 2018-02-20 MED ORDER — HYDROCODONE-ACETAMINOPHEN 5-325 MG PO TABS: 1.0000 | ORAL_TABLET | Freq: Four times a day (QID) | ORAL | 0 refills | Status: DC | PRN

## 2018-02-20 MED ORDER — CLINDAMYCIN HCL 300 MG PO CAPS
300.0000 mg | ORAL_CAPSULE | Freq: Once | ORAL | Status: AC
  Administered 2018-02-20: 300 mg via ORAL
  Filled 2018-02-20: qty 1

## 2018-02-20 NOTE — ED Triage Notes (Signed)
Pt states that she has a "lump" on the left side of her face. Pt states that she has issues with her teeth - "they're rotting. I don't have insurance to go to a dentist. They're all just falling out." Left upper tooth. Pt states she has been taking ibuprofen for pain

## 2018-02-20 NOTE — Discharge Instructions (Signed)
Take motrin for pain   Take vicodin for severe pain.   Take clindamycin for dental infection.   See list of dentists   Return to ER if you have worse dental pain, facial swelling, fever, trouble swallowing.

## 2018-02-20 NOTE — ED Provider Notes (Signed)
[facility] DEPT Provider Note   CSN: [id number] Arrival date & time: 02/20/18  8127     History   Chief Complaint Chief Complaint  Patient presents with  . Dental Pain    HPI Kristi Russell is a 57 y.o. 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  Patient states that she does not have a dentist currently.  The last 3 to 4 days, she would notice progressive swelling of the left side of her face.  She tried Motrin with no relief.  States that she has multiple dental cavities at baseline and needed to get her tooth pulled.  Denies any fevers or chills.  Denies any trouble swallowing.  The history is provided by the patient.    Past Medical History:  Diagnosis Date  . 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 Headache    "weekly" (03/10/2017)  . Hypertension 1990s  . 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 Truman Hayward on [place].  Could not afford to go back for treatment.    Patient Active Problem List   Diagnosis Date Noted  . Small bowel obstruction ([place]) 03/10/2017  . 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 Retinal detachment 01/12/2016  . Hypertension     Past Surgical History:  Procedure Laterality Date  . ABDOMINAL HYSTERECTOMY  2009   She thinks TAH/BSO:  [place], [place].  Dr. Lacinda Axon  . CESAREAN SECTION  1996  . LAPAROTOMY N/A 03/09/2017   Procedure: EXPLOR LAP, SMALL BOWEL RESECTION W/ ANASTOMOSIS, LYSIS OF ADHESIONS;  Surgeon: Kinsinger, Arta Bruce, MD;  Location: [facility];  Service: General;  Laterality: N/A;     OB History    Gravida  6   Para      Term      Preterm      AB  3   Living        SAB  1   TAB  2   Ectopic      Multiple      Live Births  3            Home Medications    Prior to  Admission medications   Medication Sig Start Date End Date Taking? Authorizing Provider  artificial tears (LACRILUBE) OINT ophthalmic ointment Place ribbon in left eye and patch closed before bedtime daily Patient not taking: Reported on 03/09/2017 02/24/17   Mack Hook, MD  cetirizine (ZYRTEC) 10 MG tablet Take 1 tablet (10 mg total) by mouth daily. Patient not taking: Reported on 07/10/2017 02/24/17   Mack Hook, MD  HYDROcodone-acetaminophen (NORCO/VICODIN) 5-325 MG tablet Take 1 tablet by mouth every 6 (six) hours as needed for severe pain. Patient not taking: Reported on 04/04/2017 03/20/17   Wellington Hampshire, PA-C  losartan-hydrochlorothiazide [facility]) 100-25 MG tablet 1 tab by mouth daily 07/15/17   Mack Hook, MD  losartan-hydrochlorothiazide [facility]) 50-12.5 MG tablet 2 tabs by mouth once daily 07/10/17   Mack Hook, MD  mometasone (NASONEX) 50 MCG/ACT nasal spray 2 sprays each nostril daily Patient not taking: Reported on 03/09/2017 02/24/17   Mack Hook, MD  varenicline (CHANTIX CONTINUING MONTH PAK) 1 MG tablet Take 1 tablet (1 mg total) by mouth 2 (two) times daily. Patient not taking: Reported on 04/04/2017 02/24/17   Mack Hook, MD  varenicline (CHANTIX STARTING MONTH PAK) 0.5 MG X 11 & 1 MG X 42 tablet Take one 0.5 mg tablet by mouth once daily for 3 days, then increase to one 0.5 mg tablet twice daily for 4 days, then increase to one 1 mg tablet twice daily. Patient not taking: Reported on 04/04/2017 02/24/17   Mack Hook, MD    Family History Family History  Problem Relation Age of Onset  . Heart disease Mother   . Hypertension Mother   . Stroke Mother        Sudden cardiac arrest vs. stroke  . Diabetes Sister   . Hypertension Sister   . Kidney disease Brother        on Dialysis  . Hypertension Daughter   . Obstructive Sleep Apnea Brother   . Breast cancer Maternal Grandmother     Social History Social History    Tobacco Use  . Smoking status: Current Every Day Smoker    Packs/day: 0.50    Years: 38.00    Pack years: 19.00    Types: Cigarettes  . Smokeless tobacco: Never Used  Substance Use Topics  . Alcohol use: Yes    Comment: occasionally  . Drug use: No     Allergies   Patient has no known allergies.   Review of Systems Review of Systems  HENT: Positive for dental problem.   All other systems reviewed and are negative.    Physical Exam Updated Vital Signs BP (!) 153/105 (BP Location: Left Arm)   Pulse 94   Temp 97.7 F (36.5 C) (Oral)   Resp 16   Ht 5\' 3"  (1.6 m)   Wt 86.2 kg   SpO2 98%   BMI 33.66 kg/m   Physical Exam Vitals signs and nursing note reviewed.  Constitutional:      Appearance: Normal appearance.  HENT:     Head: Normocephalic.     Mouth/Throat:      Comments: Multiple cavities, especially in the L lower and upper molars. No obvious periapical abscess. There is mild L facial swelling. 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 Posterior pharynx nl, no obvious PTA. No stridor  Eyes:     Extraocular Movements: Extraocular movements intact.     Pupils: Pupils are equal, round, and reactive to light.  Neck:     Musculoskeletal: Normal range of motion.     Comments: Mild L cervical LAD  Cardiovascular:     Rate and Rhythm: Normal rate and regular rhythm.  Pulmonary:     Effort: Pulmonary effort is normal.     Breath sounds: Normal breath sounds.  Abdominal:     General: Abdomen is flat. Bowel sounds are normal.     Palpations: Abdomen is soft.  Musculoskeletal: Normal range of motion.  Skin:    General: Skin is warm and dry.     Capillary Refill: Capillary refill takes less than 2 seconds.  Neurological:     General: No focal deficit present.     Mental Status: She is alert and oriented to person, place, and time.  Psychiatric:        Mood and Affect: Mood normal.      ED Treatments / Results  Labs (all labs ordered are  listed, but only abnormal results are displayed) Labs Reviewed - No data to display  EKG None  Radiology No results found.  Procedures Procedures (including critical care time)  Medications Ordered in ED Medications  clindamycin (CLEOCIN) capsule 300 mg (  has no administration in time range)     Initial Impression / Assessment and Plan / ED Course  I have reviewed the triage vital signs and the nursing notes.  Pertinent labs & imaging results that were available during my care of the patient were reviewed by me and considered in my medical decision making (see chart for details).    Mylene Bow is a 57 y.o. female here with dental pain. Has multiple cavities and likely has dental infection causing swelling. No obvious periapical abscess, no obvious PTA. Will give clinda, pain meds, dental follow up    Final Clinical Impressions(s) / ED Diagnoses   Final diagnoses:  None    ED Discharge Orders    None       Drenda Freeze, MD 02/20/18 1042

## 2018-02-27 ENCOUNTER — Emergency Department (HOSPITAL_COMMUNITY): Payer: Self-pay

## 2018-02-27 ENCOUNTER — Emergency Department (HOSPITAL_COMMUNITY)
Admission: EM | Admit: 2018-02-27 | Discharge: 2018-02-27 | Disposition: A | Discharge status: Home / Self Care | Payer: Self-pay | Attending: Emergency Medicine | Admitting: Emergency Medicine

## 2018-02-27 ENCOUNTER — Encounter (HOSPITAL_COMMUNITY): Payer: Self-pay | Admitting: *Deleted

## 2018-02-27 DIAGNOSIS — K047 Periapical abscess without sinus: Secondary | ICD-10-CM | POA: Insufficient documentation

## 2018-02-27 DIAGNOSIS — Z79899 Other long term (current) drug therapy: Secondary | ICD-10-CM | POA: Insufficient documentation

## 2018-02-27 DIAGNOSIS — I10 Essential (primary) hypertension: Secondary | ICD-10-CM | POA: Insufficient documentation

## 2018-02-27 DIAGNOSIS — K029 Dental caries, unspecified: Secondary | ICD-10-CM | POA: Insufficient documentation

## 2018-02-27 DIAGNOSIS — F17210 Nicotine dependence, cigarettes, uncomplicated: Secondary | ICD-10-CM | POA: Insufficient documentation

## 2018-02-27 LAB — CBC WITH DIFFERENTIAL/PLATELET
Abs Immature Granulocytes: 0.02 10*3/uL (ref 0.00–0.07)
BASOS ABS: 0.1 10*3/uL (ref 0.0–0.1)
Basophils Relative: 1 %
EOS PCT: 1 %
Eosinophils Absolute: 0.1 10*3/uL (ref 0.0–0.5)
HCT: 40.2 % (ref 36.0–46.0)
HEMOGLOBIN: 12.9 g/dL (ref 12.0–15.0)
Immature Granulocytes: 0 %
LYMPHS PCT: 30 %
Lymphs Abs: 2.6 10*3/uL (ref 0.7–4.0)
MCH: 31.8 pg (ref 26.0–34.0)
MCHC: 32.1 g/dL (ref 30.0–36.0)
MCV: 99 fL (ref 80.0–100.0)
Monocytes Absolute: 0.5 10*3/uL (ref 0.1–1.0)
Monocytes Relative: 6 %
NEUTROS ABS: 5.4 10*3/uL (ref 1.7–7.7)
NEUTROS PCT: 62 %
NRBC: 0 % (ref 0.0–0.2)
Platelets: 321 10*3/uL (ref 150–400)
RBC: 4.06 MIL/uL (ref 3.87–5.11)
RDW: 12.8 % (ref 11.5–15.5)
WBC: 8.8 10*3/uL (ref 4.0–10.5)

## 2018-02-27 LAB — BASIC METABOLIC PANEL
Anion gap: 9 (ref 5–15)
BUN: 13 mg/dL (ref 6–20)
CALCIUM: 10.4 mg/dL — AB (ref 8.9–10.3)
CHLORIDE: 107 mmol/L (ref 98–111)
CO2: 25 mmol/L (ref 22–32)
CREATININE: 0.59 mg/dL (ref 0.44–1.00)
GFR calc non Af Amer: >60 mL/min (ref >60)
Glucose, Bld: 128 mg/dL — ABNORMAL HIGH (ref 70–99)
Potassium: 4 mmol/L (ref 3.5–5.1)
SODIUM: 141 mmol/L (ref 135–145)

## 2018-02-27 MED ORDER — NAPROXEN 500 MG PO TABS: 500.0000 mg | ORAL_TABLET | Freq: Two times a day (BID) | ORAL | 0 refills | Status: DC

## 2018-02-27 MED ORDER — LIDOCAINE HCL (PF) 1 % IJ SOLN
5.0000 mL | Freq: Once | INTRAMUSCULAR | Status: AC
  Administered 2018-02-27: 5 mL
  Filled 2018-02-27: qty 5

## 2018-02-27 MED ORDER — FENTANYL CITRATE (PF) 100 MCG/2ML IJ SOLN
50.0000 ug | Freq: Once | INTRAMUSCULAR | Status: AC
  Administered 2018-02-27: 50 ug via INTRAVENOUS
  Filled 2018-02-27: qty 2

## 2018-02-27 MED ORDER — CLINDAMYCIN HCL 150 MG PO CAPS: 450.0000 mg | ORAL_CAPSULE | Freq: Three times a day (TID) | ORAL | 0 refills | Status: DC

## 2018-02-27 MED ORDER — IOHEXOL 300 MG/ML  SOLN
75.0000 mL | Freq: Once | INTRAMUSCULAR | Status: AC | PRN
  Administered 2018-02-27: 75 mL via INTRAVENOUS

## 2018-02-27 NOTE — ED Notes (Signed)
Pt states she did take antibiotics prescribed by [facility] and can only find one appropriate dental clinic that will take her and they do not have an appointment until March.

## 2018-02-27 NOTE — ED Triage Notes (Signed)
Pt in c/o swelling to the left side of her upper jaw, seen at [facility] for same and started on antibiotics, no improvement and pt feels like swelling has increased

## 2018-02-27 NOTE — ED Provider Notes (Signed)
[facility] Provider Note   CSN: [id number] Arrival date & time: 02/27/18  1344     History   Chief Complaint Chief Complaint  Patient presents with  . Facial Swelling    HPI Krist[facility] is a 57 y.o. female with a history of tobacco abuse, left-sided facial paralysis, neuromuscular disorder, and hypertension presents to the emergency department with progressively worsening left-sided facial swelling and dental pain since last ER visit 1 week prior.  Patient seen in the ED 02/20/18 for L sided facial pain/swelling, was noted to have multiple dental caries, presumed dental infection, placed on clindamycin 300 mg QID.  States she has been taking her antibiotic as prescribed however the pain and swelling have progressively worsened.  Her current discomfort is a out of 10 in severity.  No alleviating or aggravating factors.  She has not followed up with dentistry.  Denies fever, chills, nausea, vomiting, difficulty swallowing, difficulty breathing, or neck stiffness.  HPI  Past Medical History:  Diagnosis Date  . 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 Headache    "weekly" (03/10/2017)  . Hypertension 1990s  . 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 Truman Hayward on [place].  Could not afford to go back for treatment.    Patient Active Problem List   Diagnosis Date Noted  . Small bowel obstruction ([place]) 03/10/2017  . 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 Retinal detachment 01/12/2016  . Hypertension     Past Surgical History:  Procedure Laterality Date  . ABDOMINAL HYSTERECTOMY  2009   She thinks TAH/BSO:  [place], [place].  Dr. Lacinda Axon  . CESAREAN SECTION  1996  . LAPAROTOMY N/A 03/09/2017   Procedure: EXPLOR LAP, SMALL BOWEL RESECTION W/ ANASTOMOSIS, LYSIS OF ADHESIONS;  Surgeon: Kinsinger, Arta Bruce, MD;  Location: [facility];   Service: General;  Laterality: N/A;     OB History    Gravida  6   Para      Term      Preterm      AB  3   Living        SAB  1   TAB  2   Ectopic      Multiple      Live Births  3            Home Medications    Prior to Admission medications   Medication Sig Start Date End Date Taking? Authorizing Provider  artificial tears (LACRILUBE) OINT ophthalmic ointment Place ribbon in left eye and patch closed before bedtime daily Patient not taking: Reported on 03/09/2017 02/24/17   Mack Hook, MD  cetirizine (ZYRTEC) 10 MG tablet Take 1 tablet (10 mg total) by mouth daily. Patient not taking: Reported on 07/10/2017 02/24/17   Mack Hook, MD  clindamycin (CLEOCIN) 300 MG capsule Take 1 capsule (300 mg total) by mouth 4 (four) times daily. X 7 days 02/20/18   Drenda Freeze, MD  HYDROcodone-acetaminophen (NORCO/VICODIN) 5-325 MG tablet Take 1 tablet by mouth every 6 (six) hours as needed for severe pain. 02/20/18   Drenda Freeze, MD  losartan-hydrochlorothiazide [facility]) 100-25 MG tablet 1 tab by mouth daily 07/15/17   Mack Hook, MD  losartan-hydrochlorothiazide [facility]) 50-12.5 MG tablet 2 tabs by mouth once daily 07/10/17   Mack Hook, MD  mometasone (NASONEX) 50 MCG/ACT nasal spray 2 sprays each  nostril daily Patient not taking: Reported on 03/09/2017 02/24/17   Mack Hook, MD  varenicline (CHANTIX CONTINUING MONTH PAK) 1 MG tablet Take 1 tablet (1 mg total) by mouth 2 (two) times daily. Patient not taking: Reported on 04/04/2017 02/24/17   Mack Hook, MD  varenicline (CHANTIX STARTING MONTH PAK) 0.5 MG X 11 & 1 MG X 42 tablet Take one 0.5 mg tablet by mouth once daily for 3 days, then increase to one 0.5 mg tablet twice daily for 4 days, then increase to one 1 mg tablet twice daily. Patient not taking: Reported on 04/04/2017 02/24/17   Mack Hook, MD    Family History Family History  Problem Relation  Age of Onset  . Heart disease Mother   . Hypertension Mother   . Stroke Mother        Sudden cardiac arrest vs. stroke  . Diabetes Sister   . Hypertension Sister   . Kidney disease Brother        on Dialysis  . Hypertension Daughter   . Obstructive Sleep Apnea Brother   . Breast cancer Maternal Grandmother     Social History Social History   Tobacco Use  . Smoking status: Current Every Day Smoker    Packs/day: 0.50    Years: 38.00    Pack years: 19.00    Types: Cigarettes  . Smokeless tobacco: Never Used  Substance Use Topics  . Alcohol use: Yes    Comment: occasionally  . Drug use: No     Allergies   Patient has no known allergies.   Review of Systems Review of Systems  Constitutional: Negative for chills and fever.  HENT: Positive for dental problem and facial swelling. Negative for sore throat, trouble swallowing and voice change.   Eyes: Negative for visual disturbance.  Gastrointestinal: Negative for nausea and vomiting.  Musculoskeletal: Negative for neck pain.  Neurological: Positive for weakness (L sided facial chronic).  All other systems reviewed and are negative.    Physical Exam Updated Vital Signs BP (!) 136/98   Pulse 79   Temp 98.6 F (37 C) (Oral)   Resp 16   SpO2 98%   Physical Exam Vitals signs and nursing note reviewed.  Constitutional:      General: She is not in acute distress.    Appearance: She is well-developed. She is not toxic-appearing.  HENT:     Head: Normocephalic and atraumatic.     Right Ear: Tympanic membrane normal. Tympanic membrane is not perforated, erythematous, retracted or bulging.     Left Ear: Tympanic membrane normal. Tympanic membrane is not perforated, erythematous, retracted or bulging.     Nose: Nose normal.     Mouth/Throat:     Pharynx: Oropharynx is clear. Uvula midline. No oropharyngeal exudate, posterior oropharyngeal erythema or uvula swelling.     Tonsils: No tonsillar abscesses.      Comments:  Areas circled in red with multiple dental caries.  L upper molar external gingiva with obvious abscess, approximately 2cm in diameter palpable along the gumline. Seems to track superiorly. Palpable L sided facial induration. There is left sided facial swelling, no obvious erythema/warmth.  Patient does have L sided facial paralysis at baseline.  Tolerating own secretions without difficulty. No hot potato voice. Able to open the mouth approximately 2 finger widths.  Space beneath the tongue without swelling, soft to palpation.  Eyes:     General:        Right eye: No discharge.  Left eye: No discharge.     Conjunctiva/sclera: Conjunctivae normal.     Comments: EOMI  Neck:     Musculoskeletal: Normal range of motion and neck supple. No edema, erythema or neck rigidity.     Comments:    Cardiovascular:     Rate and Rhythm: Normal rate and regular rhythm.  Pulmonary:     Effort: Pulmonary effort is normal.     Breath sounds: Normal breath sounds.  Neurological:     Mental Status: She is alert.  Psychiatric:        Behavior: Behavior normal.        Thought Content: Thought content normal.    ED Treatments / Results  Labs (all labs ordered are listed, but only abnormal results are displayed) Labs Reviewed  BASIC METABOLIC PANEL - Abnormal; Notable for the following components:      Result Value   Glucose, Bld 128 (*)    Calcium 10.4 (*)    All other components within normal limits  CBC WITH DIFFERENTIAL/PLATELET    EKG None  Radiology Ct Soft Tissue Neck W Contrast  Result Date: 02/27/2018 CLINICAL DATA:  58 y/o F; swelling of the left side of the upper jaw. EXAM: CT NECK WITH CONTRAST TECHNIQUE: Multidetector CT imaging of the neck was performed using the standard protocol following the bolus administration of intravenous contrast. CONTRAST:  80mL OMNIPAQUE IOHEXOL 300 MG/ML  SOLN COMPARISON:  None. FINDINGS: Pharynx and larynx: Normal. No mass or swelling. Salivary  glands: No inflammation, mass, or stone. Thyroid: 16 mm nodule with coarse calcifications along posterior margin of right lobe of thyroid (series 3, image 66). Lymph nodes: None enlarged or abnormal density. Vascular: Negative. Limited intracranial: Negative. Visualized orbits: Negative. Mastoids and visualized paranasal sinuses: Moderate mucosal thickening of the left maxillary sinus alveolar recess with multiple adjacent periapical cysts, possibly odontogenic mucositis. Skeleton: No acute or aggressive process. Upper chest: Mild centrilobular emphysema of the lung apices. Other: 19 x 10 x 17 mm abscess (AP x ML x CC series 3, image 29 and series 6, image 18) overlying the left maxillary alveolar bone, probably related to periapical disease of left maxillary premolars and molars. Surrounding inflammation in the superficial soft tissues of the left face. IMPRESSION: 1. 19 mm odontogenic abscess overlying the left anterolateral maxillary alveolar bone. Diffuse dental disease with multiple caries and periapical cysts. 2. Left maxillary sinus mucosal thickening with adjacent prominent periapical dental disease, possibly odontogenic maxillary sinusitis. 3. 16 mm nodule with calcifications in right lobe of thyroid. Thyroid ultrasound is recommended on a nonemergent basis. Electronically Signed   By: Kristine Garbe M.D.   On: 02/27/2018 19:15    Procedures .Marland KitchenIncision and Drainage Date/Time: 02/27/2018 9:31 PM Performed by: Amaryllis Dyke, PA-C Authorized by: Amaryllis Dyke, PA-C   Consent:    Consent obtained:  Verbal   Consent given by:  Patient   Risks discussed:  Bleeding, damage to other organs, infection, incomplete drainage and pain   Alternatives discussed:  No treatment Location:    Type:  Abscess   Size:  2   Location:  Mouth   Mouth location: left upper ginigiva. Anesthesia (see MAR for exact dosages):    Anesthesia method:  Local infiltration   Local anesthetic:   Lidocaine 1% w/o epi Procedure type:    Complexity:  Simple Procedure details:    Incision types:  Stab incision   Scalpel blade:  11   Drainage:  Bloody and purulent  Drainage amount:  Moderate   Wound treatment:  Wound left open   Packing materials:  None   (including critical care time)  Medications Ordered in ED Medications  fentaNYL (SUBLIMAZE) injection 50 mcg (50 mcg Intravenous Given 02/27/18 1818)  iohexol (OMNIPAQUE) 300 MG/ML solution 75 mL (75 mLs Intravenous Contrast Given 02/27/18 1850)  lidocaine (PF) (XYLOCAINE) 1 % injection 5 mL (5 mLs Infiltration Given 02/27/18 2130)     Initial Impression / Assessment and Plan / ED Course  I have reviewed the triage vital signs and the nursing notes.  Pertinent labs & imaging results that were available during my care of the patient were reviewed by me and considered in my medical decision making (see chart for details).   Patient presents with complaints of dental pain & facial swelling worsening since last ER visit 1 week prior, compliant with clindamycin. There is obvious abscess to L upper gingiva with associated facial swelling and some induration noted, likely resistant to clinda secondary to needing I&D. She is able to open her mouth about 2 finger widths on initial assessment. Tolerating own secretions without difficulty. Submandibular space is soft and without swelling. Further evaluate with labs & CT w contrast.   Labs fairly unremarkable. No associated leukocytosis. Afebrile. Does not meet sepsis criteria. CT scan reveals 19 mm odontogenic abscess overlying the left anterolateral maxillary alveolar bone. Diffuse dental disease with multiple caries and periapical cysts. There is left maxillary sinus mucosal thickening with adjacent prominent periapical dental disease, possibly odontogenic maxillary sinusitis.   After pain medicine able to open mouth fully. CT and exam do not seem consistent with Lud[place]igs angina. Discussed  with supervising physician Dr. Melina Copa- recommends Bedside I&D & discharge on clindamycin. I&D per procedure note above, tolerated well. Will discharge with clindamycin as well as naproxen for pain/swelling (renal function WNL). We discussed the importance of very close dentistry follow up and return precautions. I discussed results, treatment plan, need for follow-up, and return precautions with the patient. Provided opportunity for questions, patient confirmed understanding and is in agreement with plan.   Final Clinical Impressions(s) / ED Diagnoses   Final diagnoses:  Dental abscess    ED Discharge Orders         Ordered    naproxen (NAPROSYN) 500 MG tablet  2 times daily     02/27/18 2135    clindamycin (CLEOCIN) 150 MG capsule  3 times daily     02/27/18 2135           Amaryllis Dyke, PA-C 02/27/18 2209    Hayden Rasmussen, MD 02/28/18 1119

## 2018-02-27 NOTE — Discharge Instructions (Signed)
Call one of the dentists offices provided to schedule an appointment for re-evaluation and further management within the next 48 hours.   Your CT showed that you have a dental abscess which was incised and drained. Your CT also showed a thyroid nodule- discuss this with primary care to obtain an ultrasound   I have prescribed you Clindamycin which is an antibiotic to treat the infection and Naproxen which is an anti-inflammatory medicine to treat the pain.   Please take all of your antibiotics until finished. You may develop abdominal discomfort or diarrhea from the antibiotic.  You may help offset this with probiotics which you can buy at the store (ask your pharmacist if unable to find) or get probiotics in the form of eating yogurt. Do not eat or take the probiotics until 2 hours after your antibiotic. If you are unable to tolerate these side effects follow-up with your primary care provider or return to the emergency department.   If you begin to experience any blistering, rashes, swelling, or difficulty breathing seek medical care for evaluation of potentially more serious side effects.   Be sure to eat something when taking the Naproxen as it can cause stomach upset and at worst stomach bleeding. Do not take additional non steroidal anti-inflammatory medicines such as Ibuprofen, Aleve, Advil, Mobic, Diclofenac, or goodie powder while taking Naproxen. You may supplement with Tylenol.   We have prescribed you new medication(s) today. Discuss the medications prescribed today with your pharmacist as they can have adverse effects and interactions with your other medicines including over the counter and prescribed medications. Seek medical evaluation if you start to experience new or abnormal symptoms after taking one of these medicines, seek care immediately if you start to experience difficulty breathing, feeling of your throat closing, facial swelling, or rash as these could be indications of a more  serious allergic reaction  If you start to experience and new or worsening symptoms return to the emergency department. If you start to experience fever, chills, neck stiffness/pain, or inability to move your neck or open your mouth come back to the emergency department immediately.

## 2018-03-12 ENCOUNTER — Encounter (HOSPITAL_COMMUNITY): Payer: Self-pay

## 2018-03-12 ENCOUNTER — Inpatient Hospital Stay (HOSPITAL_COMMUNITY)
Admission: EM | Admit: 2018-03-12 | Discharge: 2018-03-16 | DRG: 872 | Disposition: A | Discharge status: Home / Self Care | Payer: Self-pay | Attending: Internal Medicine | Admitting: Internal Medicine

## 2018-03-12 ENCOUNTER — Emergency Department (HOSPITAL_COMMUNITY): Payer: Self-pay

## 2018-03-12 ENCOUNTER — Observation Stay (HOSPITAL_COMMUNITY): Payer: Self-pay

## 2018-03-12 DIAGNOSIS — Z8249 Family history of ischemic heart disease and other diseases of the circulatory system: Secondary | ICD-10-CM

## 2018-03-12 DIAGNOSIS — R7303 Prediabetes: Secondary | ICD-10-CM | POA: Diagnosis present

## 2018-03-12 DIAGNOSIS — K011 Impacted teeth: Secondary | ICD-10-CM | POA: Diagnosis present

## 2018-03-12 DIAGNOSIS — K029 Dental caries, unspecified: Secondary | ICD-10-CM | POA: Diagnosis present

## 2018-03-12 DIAGNOSIS — I10 Essential (primary) hypertension: Secondary | ICD-10-CM | POA: Diagnosis present

## 2018-03-12 DIAGNOSIS — Z833 Family history of diabetes mellitus: Secondary | ICD-10-CM

## 2018-03-12 DIAGNOSIS — R220 Localized swelling, mass and lump, head: Secondary | ICD-10-CM

## 2018-03-12 DIAGNOSIS — Z79899 Other long term (current) drug therapy: Secondary | ICD-10-CM

## 2018-03-12 DIAGNOSIS — K047 Periapical abscess without sinus: Secondary | ICD-10-CM | POA: Diagnosis present

## 2018-03-12 DIAGNOSIS — K083 Retained dental root: Secondary | ICD-10-CM | POA: Diagnosis present

## 2018-03-12 DIAGNOSIS — K06020 Generalized gingival recession, unspecified: Secondary | ICD-10-CM | POA: Diagnosis present

## 2018-03-12 DIAGNOSIS — M264 Malocclusion, unspecified: Secondary | ICD-10-CM | POA: Diagnosis present

## 2018-03-12 DIAGNOSIS — K045 Chronic apical periodontitis: Secondary | ICD-10-CM | POA: Diagnosis present

## 2018-03-12 DIAGNOSIS — Z90710 Acquired absence of both cervix and uterus: Secondary | ICD-10-CM

## 2018-03-12 DIAGNOSIS — N3000 Acute cystitis without hematuria: Secondary | ICD-10-CM | POA: Diagnosis present

## 2018-03-12 DIAGNOSIS — F17210 Nicotine dependence, cigarettes, uncomplicated: Secondary | ICD-10-CM | POA: Diagnosis present

## 2018-03-12 DIAGNOSIS — L03211 Cellulitis of face: Secondary | ICD-10-CM | POA: Diagnosis present

## 2018-03-12 DIAGNOSIS — N938 Other specified abnormal uterine and vaginal bleeding: Secondary | ICD-10-CM | POA: Diagnosis present

## 2018-03-12 DIAGNOSIS — G510 Bell's palsy: Secondary | ICD-10-CM | POA: Diagnosis present

## 2018-03-12 DIAGNOSIS — R6510 Systemic inflammatory response syndrome (SIRS) of non-infectious origin without acute organ dysfunction: Secondary | ICD-10-CM | POA: Diagnosis present

## 2018-03-12 DIAGNOSIS — G8194 Hemiplegia, unspecified affecting left nondominant side: Secondary | ICD-10-CM | POA: Diagnosis present

## 2018-03-12 DIAGNOSIS — K0401 Reversible pulpitis: Secondary | ICD-10-CM | POA: Diagnosis present

## 2018-03-12 DIAGNOSIS — A419 Sepsis, unspecified organism: Principal | ICD-10-CM | POA: Diagnosis present

## 2018-03-12 LAB — LIPASE, BLOOD: Lipase: 21 U/L (ref 11–51)

## 2018-03-12 LAB — URINALYSIS, ROUTINE W REFLEX MICROSCOPIC
BILIRUBIN URINE: NEGATIVE
Glucose, UA: NEGATIVE mg/dL
Ketones, ur: 5 mg/dL — AB
Nitrite: POSITIVE — AB
Protein, ur: 30 mg/dL — AB
Specific Gravity, Urine: 1.016 (ref 1.005–1.030)
WBC, UA: >50 WBC/hpf — ABNORMAL HIGH (ref 0–5)
pH: 5 (ref 5.0–8.0)

## 2018-03-12 LAB — COMPREHENSIVE METABOLIC PANEL
ALBUMIN: 3.6 g/dL (ref 3.5–5.0)
ALT: 17 U/L (ref 0–44)
AST: 36 U/L (ref 15–41)
Alkaline Phosphatase: 55 U/L (ref 38–126)
Anion gap: 8 (ref 5–15)
BILIRUBIN TOTAL: 0.8 mg/dL (ref 0.3–1.2)
BUN: 9 mg/dL (ref 6–20)
CO2: 26 mmol/L (ref 22–32)
Calcium: 10.4 mg/dL — ABNORMAL HIGH (ref 8.9–10.3)
Chloride: 105 mmol/L (ref 98–111)
Creatinine, Ser: 0.67 mg/dL (ref 0.44–1.00)
GFR calc Af Amer: >60 mL/min (ref >60)
GFR calc non Af Amer: >60 mL/min (ref >60)
GLUCOSE: 136 mg/dL — AB (ref 70–99)
Potassium: 3.9 mmol/L (ref 3.5–5.1)
Sodium: 139 mmol/L (ref 135–145)
Total Protein: 6.8 g/dL (ref 6.5–8.1)

## 2018-03-12 LAB — CBC WITH DIFFERENTIAL/PLATELET
ABS IMMATURE GRANULOCYTES: 0.01 10*3/uL (ref 0.00–0.07)
Basophils Absolute: 0 10*3/uL (ref 0.0–0.1)
Basophils Relative: 0 %
Eosinophils Absolute: 0 10*3/uL (ref 0.0–0.5)
Eosinophils Relative: 0 %
HCT: 41.7 % (ref 36.0–46.0)
Hemoglobin: 13.1 g/dL (ref 12.0–15.0)
Immature Granulocytes: 0 %
Lymphocytes Relative: 8 %
Lymphs Abs: 0.5 10*3/uL — ABNORMAL LOW (ref 0.7–4.0)
MCH: 30.8 pg (ref 26.0–34.0)
MCHC: 31.4 g/dL (ref 30.0–36.0)
MCV: 98.1 fL (ref 80.0–100.0)
Monocytes Absolute: 0.2 10*3/uL (ref 0.1–1.0)
Monocytes Relative: 3 %
Neutro Abs: 5 10*3/uL (ref 1.7–7.7)
Neutrophils Relative %: 89 %
Platelets: 268 10*3/uL (ref 150–400)
RBC: 4.25 MIL/uL (ref 3.87–5.11)
RDW: 13.1 % (ref 11.5–15.5)
WBC: 5.7 10*3/uL (ref 4.0–10.5)
nRBC: 0 % (ref 0.0–0.2)

## 2018-03-12 LAB — I-STAT CG4 LACTIC ACID, ED
Lactic Acid, Venous: 0.9 mmol/L (ref 0.5–1.9)
Lactic Acid, Venous: 0.75 mmol/L (ref 0.5–1.9)

## 2018-03-12 LAB — I-STAT BETA HCG BLOOD, ED (MC, WL, AP ONLY): I-stat hCG, quantitative: <5 m[IU]/mL (ref <5)

## 2018-03-12 LAB — INFLUENZA PANEL BY PCR (TYPE A & B)
Influenza A By PCR: NEGATIVE
Influenza B By PCR: NEGATIVE

## 2018-03-12 MED ORDER — ACETAMINOPHEN 325 MG PO TABS
325.0000 mg | ORAL_TABLET | Freq: Once | ORAL | Status: DC
  Filled 2018-03-12: qty 1

## 2018-03-12 MED ORDER — KETOROLAC TROMETHAMINE 15 MG/ML IJ SOLN
15.0000 mg | Freq: Once | INTRAMUSCULAR | Status: AC
  Administered 2018-03-12: 15 mg via INTRAVENOUS
  Filled 2018-03-12: qty 1

## 2018-03-12 MED ORDER — IOHEXOL 300 MG/ML  SOLN
75.0000 mL | Freq: Once | INTRAMUSCULAR | Status: AC | PRN
  Administered 2018-03-12: 75 mL via INTRAVENOUS

## 2018-03-12 MED ORDER — LACTATED RINGERS IV SOLN
INTRAVENOUS | Status: DC
  Administered 2018-03-12: 23:00:00 via INTRAVENOUS

## 2018-03-12 MED ORDER — ACETAMINOPHEN 500 MG PO TABS
1000.0000 mg | ORAL_TABLET | Freq: Once | ORAL | Status: AC
  Administered 2018-03-12: 1000 mg via ORAL
  Filled 2018-03-12: qty 2

## 2018-03-12 MED ORDER — IOHEXOL 300 MG/ML  SOLN
100.0000 mL | Freq: Once | INTRAMUSCULAR | Status: AC | PRN
  Administered 2018-03-12: 100 mL via INTRAVENOUS

## 2018-03-12 MED ORDER — ACETAMINOPHEN 325 MG PO TABS
650.0000 mg | ORAL_TABLET | Freq: Once | ORAL | Status: AC
  Administered 2018-03-12: 650 mg via ORAL
  Filled 2018-03-12: qty 2

## 2018-03-12 MED ORDER — ONDANSETRON HCL 4 MG/2ML IJ SOLN
4.0000 mg | Freq: Once | INTRAMUSCULAR | Status: AC
  Administered 2018-03-12: 4 mg via INTRAVENOUS
  Filled 2018-03-12: qty 2

## 2018-03-12 MED ORDER — LACTATED RINGERS IV BOLUS
1000.0000 mL | Freq: Once | INTRAVENOUS | Status: AC
  Administered 2018-03-12: 1000 mL via INTRAVENOUS

## 2018-03-12 MED ORDER — SODIUM CHLORIDE 0.9 % IV SOLN
1.0000 g | Freq: Once | INTRAVENOUS | Status: AC
  Administered 2018-03-12: 1 g via INTRAVENOUS
  Filled 2018-03-12: qty 10

## 2018-03-12 NOTE — ED Triage Notes (Signed)
Pt reports abd pain, back pain and loss of appetite for the past 2 weeks. Also reports vaginal bleeding when she wipes for 2 weeks. Denies heavy bleeding but states she has had a hysterectomy.

## 2018-03-12 NOTE — ED Provider Notes (Signed)
[facility] EMERGENCY DEPARTMENT Provider Note   CSN: [id number] Arrival date & time: 03/12/18  1245     History   Chief Complaint Chief Complaint  Patient presents with  . Abdominal Pain  . Vaginal Bleeding    HPI Kristi Russell is a 58 y.o. female.  The history is provided by the patient.  Abdominal Pain   This is a new problem. The current episode started yesterday. The problem occurs constantly. The problem has not changed since onset.The pain is associated with an unknown factor. The pain is located in the suprapubic region. The quality of the pain is aching and dull. The pain is at a severity of 3/10. The pain is moderate. Associated symptoms include fever, nausea, dysuria, frequency, hematuria (possible scant vaginal bleeding) and myalgias. Pertinent negatives include anorexia, diarrhea, flatus, melena, vomiting, constipation and arthralgias. Nothing aggravates the symptoms. Nothing relieves the symptoms. Past workup includes surgery.    Past Medical History:  Diagnosis Date  . 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 Headache    "weekly" (03/10/2017)  . Hypertension 1990s  . 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 Truman Hayward on [place].  Could not afford to go back for treatment.    Patient Active Problem List   Diagnosis Date Noted  . SIRS (systemic inflammatory response syndrome) ([place]) 03/12/2018  . Small bowel obstruction ([place]) 03/10/2017  . 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 Retinal detachment 01/12/2016  . Hypertension     Past Surgical History:  Procedure Laterality Date  . ABDOMINAL HYSTERECTOMY  2009   She thinks TAH/BSO:  [place].  Dr. Lacinda Axon  . CESAREAN SECTION  1996  . LAPAROTOMY N/A 03/09/2017   Procedure: EXPLOR LAP, SMALL BOWEL RESECTION W/ ANASTOMOSIS, LYSIS OF ADHESIONS;  Surgeon: Kinsinger, Arta Bruce, MD;   Location: [facility];  Service: General;  Laterality: N/A;     OB History    Gravida  6   Para      Term      Preterm      AB  3   Living        SAB  1   TAB  2   Ectopic      Multiple      Live Births  3            Home Medications    Prior to Admission medications   Medication Sig Start Date End Date Taking? Authorizing Provider  losartan-hydrochlorothiazide Kon[facility] Penta) 100-25 MG tablet 1 tab by mouth daily 07/15/17  Yes Mack Hook, MD  artificial tears (LACRILUBE) OINT ophthalmic ointment Place ribbon in left eye and patch closed before bedtime daily Patient not taking: Reported on 03/09/2017 02/24/17   Mack Hook, MD  cetirizine (ZYRTEC) 10 MG tablet Take 1 tablet (10 mg total) by mouth daily. Patient not taking: Reported on 07/10/2017 02/24/17   Mack Hook, MD  clindamycin (CLEOCIN) 150 MG capsule Take 3 capsules (450 mg total) by mouth 3 (three) times daily. Patient not taking: Reported on 03/12/2018 02/27/18   Petrucelli, Glynda Jaeger, PA-C  HYDROcodone-acetaminophen (NORCO/VICODIN) 5-325 MG tablet Take 1 tablet by mouth every 6 (six) hours as needed for severe pain. Patient not taking: Reported on 03/12/2018 02/20/18   Drenda Freeze, MD  losartan-hydrochlorothiazide [facility]) 50-12.5 MG tablet 2 tabs by mouth once daily Patient not taking: Reported on  03/12/2018 07/10/17   Mack Hook, MD  mometasone (NASONEX) 50 MCG/ACT nasal spray 2 sprays each nostril daily Patient not taking: Reported on 03/09/2017 02/24/17   Mack Hook, MD  naproxen (NAPROSYN) 500 MG tablet Take 1 tablet (500 mg total) by mouth 2 (two) times daily. Patient not taking: Reported on 03/12/2018 02/27/18   Petrucelli, Glynda Jaeger, PA-C  varenicline (CHANTIX CONTINUING MONTH PAK) 1 MG tablet Take 1 tablet (1 mg total) by mouth 2 (two) times daily. Patient not taking: Reported on 04/04/2017 02/24/17   Mack Hook, MD  varenicline (CHANTIX STARTING MONTH PAK)  0.5 MG X 11 & 1 MG X 42 tablet Take one 0.5 mg tablet by mouth once daily for 3 days, then increase to one 0.5 mg tablet twice daily for 4 days, then increase to one 1 mg tablet twice daily. Patient not taking: Reported on 04/04/2017 02/24/17   Mack Hook, MD    Family History Family History  Problem Relation Age of Onset  . Heart disease Mother   . Hypertension Mother   . Stroke Mother        Sudden cardiac arrest vs. stroke  . Diabetes Sister   . Hypertension Sister   . Kidney disease Brother        on Dialysis  . Hypertension Daughter   . Obstructive Sleep Apnea Brother   . Breast cancer Maternal Grandmother     Social History Social History   Tobacco Use  . Smoking status: Current Every Day Smoker    Packs/day: 0.50    Years: 38.00    Pack years: 19.00    Types: Cigarettes  . Smokeless tobacco: Never Used  Substance Use Topics  . Alcohol use: Yes    Comment: occasionally  . Drug use: No     Allergies   Patient has no known allergies.   Review of Systems Review of Systems  Constitutional: Positive for chills and fever.  HENT: Negative for ear pain and sore throat.   Eyes: Negative for pain and visual disturbance.  Respiratory: Negative for cough and shortness of breath.   Cardiovascular: Negative for chest pain and palpitations.  Gastrointestinal: Positive for abdominal pain and nausea. Negative for abdominal distention, anorexia, constipation, diarrhea, flatus, melena and vomiting.  Genitourinary: Positive for dysuria, frequency, hematuria (possible scant vaginal bleeding), urgency and vaginal bleeding. Negative for vaginal discharge and vaginal pain.  Musculoskeletal: Positive for myalgias. Negative for arthralgias and back pain.  Skin: Negative for color change, rash and wound.  Neurological: Negative for seizures and syncope.  All other systems reviewed and are negative.    Physical Exam Updated Vital Signs  ED Triage Vitals  Enc Vitals  Group     BP 03/12/18 1258 (!) 166/99     Pulse Rate 03/12/18 1258 (!) 110     Resp 03/12/18 1258 16     Temp 03/12/18 1258 (!) 102.7 F (39.3 C)     Temp Source 03/12/18 1258 Oral     SpO2 03/12/18 1258 97 %     Weight 03/12/18 1258 190 lb (86.2 kg)     Height 03/12/18 1258 5\' 3"  (1.6 m)     Head Circumference --      Peak Flow --      Pain Score 03/12/18 1305 7     Pain Loc --      Pain Edu? --      Excl. in [place]? --     Physical Exam Vitals signs and nursing note  reviewed.  Constitutional:      General: She is not in acute distress.    Appearance: She is well-developed.  HENT:     Head: Normocephalic and atraumatic.     Comments: Left-sided facial weakness at baseline, no obvious dental infection/abscess, no facial swelling    Mouth/Throat:     Mouth: Mucous membranes are moist.  Eyes:     Extraocular Movements: Extraocular movements intact.     Conjunctiva/sclera: Conjunctivae normal.  Neck:     Musculoskeletal: Neck supple.  Cardiovascular:     Rate and Rhythm: Normal rate and regular rhythm.     Heart sounds: Normal heart sounds. No murmur.  Pulmonary:     Effort: Pulmonary effort is normal. No respiratory distress.     Breath sounds: Normal breath sounds.  Abdominal:     General: There is no distension.     Palpations: Abdomen is soft.     Tenderness: There is abdominal tenderness in the suprapubic area. There is no right CVA tenderness, left CVA tenderness, guarding or rebound.  Genitourinary:    Vagina: Normal. No signs of injury. No vaginal discharge, erythema, tenderness or bleeding.  Skin:    General: Skin is warm and dry.     Capillary Refill: Capillary refill takes less than 2 seconds.  Neurological:     Mental Status: She is alert.      ED Treatments / Results  Labs (all labs ordered are listed, but only abnormal results are displayed) Labs Reviewed  COMPREHENSIVE METABOLIC PANEL - Abnormal; Notable for the following components:      Result  Value   Glucose, Bld 136 (*)    Calcium 10.4 (*)    All other components within normal limits  CBC WITH DIFFERENTIAL/PLATELET - Abnormal; Notable for the following components:   Lymphs Abs 0.5 (*)    All other components within normal limits  URINALYSIS, ROUTINE W REFLEX MICROSCOPIC - Abnormal; Notable for the following components:   APPearance CLOUDY (*)    Hgb urine dipstick LARGE (*)    Ketones, ur 5 (*)    Protein, ur 30 (*)    Nitrite POSITIVE (*)    Leukocytes, UA LARGE (*)    WBC, UA >50 (*)    Bacteria, UA FEW (*)    All other components within normal limits  CULTURE, BLOOD (ROUTINE X 2)  CULTURE, BLOOD (ROUTINE X 2)  URINE CULTURE  LIPASE, BLOOD  INFLUENZA PANEL BY PCR (TYPE A & B)  I-STAT CG4 LACTIC ACID, ED  I-STAT BETA HCG BLOOD, ED ([facility], [facility], [facility] ONLY)  I-STAT CG4 LACTIC ACID, ED  I-STAT CG4 LACTIC ACID, ED    EKG None  Radiology Dg Chest 2 View  Result Date: 03/12/2018 CLINICAL DATA:  Fever. Patient reports abdominal and back pain. EXAM: CHEST - 2 VIEW COMPARISON:  Chest radiograph 03/10/2017 FINDINGS: Unchanged heart size and mediastinal contours with borderline cardiomegaly. No pulmonary edema, confluent airspace disease, pleural effusion or pneumothorax. No acute osseous abnormalities are seen. IMPRESSION: No acute pulmonary process. Electronically Signed   By: Keith Rake M.D.   On: 03/12/2018 19:07   Ct Abdomen Pelvis W Contrast  Result Date: 03/12/2018 CLINICAL DATA:  Pt reports abd pain, back pain and loss of appetite for the past 2 weeks. Also reports vaginal bleeding when she wipes for 2 weeks. Denies heavy bleeding but states she has had a hysterectomy EXAM: CT ABDOMEN AND PELVIS WITH CONTRAST TECHNIQUE: Multidetector CT imaging of the abdomen and pelvis  was performed using the standard protocol following bolus administration of intravenous contrast. CONTRAST:  181mL OMNIPAQUE IOHEXOL 300 MG/ML  SOLN COMPARISON:  CT, 03/09/2017 FINDINGS: Lower chest: No  acute abnormality. Hepatobiliary: No focal liver abnormality is seen. No gallstones, gallbladder wall thickening, or biliary dilatation. Pancreas: Unremarkable. No pancreatic ductal dilatation or surrounding inflammatory changes. Spleen: Normal in size without focal abnormality. Adrenals/Urinary Tract: 12 mm left adrenal nodule, stable. Normal right adrenal gland. Kidneys normal size, orientation and position with symmetric enhancement and excretion. No masses, stones or hydronephrosis. Ureters are normal in course and in caliber. Bladder is unremarkable. Stomach/Bowel: Small bowel anastomosis staple line in the right anterior pelvis. No bowel dilation, wall thickening or adjacent inflammation. Stomach is unremarkable. Normal appendix visualized. Vascular/Lymphatic: Mild aortic atherosclerosis. No other vascular abnormality. No adenopathy. Reproductive: Status post hysterectomy. No adnexal masses. Other: No hernia. Hernia noted on the prior CT has been repaired. No ascites. Musculoskeletal: No acute or significant osseous findings. IMPRESSION: 1. No acute findings within the abdomen or pelvis. 2. 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 3. Status post hysterectomy and small bowel anastomosis. 4. Mild aortic atherosclerosis. Electronically Signed   By: Lajean Manes M.D.   On: 03/12/2018 19:40    Procedures .Critical Care Performed by: Lennice Sites, DO Authorized by: Lennice Sites, DO   Critical care provider statement:    Critical care time (minutes):  45   Critical care was necessary to treat or prevent imminent or life-threatening deterioration of the following conditions:  Sepsis   Critical care was time spent personally by me on the following activities:  Discussions with primary provider, development of treatment plan with patient or surrogate, evaluation of patient's response to treatment, examination of patient, obtaining history from patient or surrogate, ordering and performing  treatments and interventions, ordering and review of laboratory studies, ordering and review of radiographic studies, pulse oximetry, re-evaluation of patient's condition and review of old charts   I assumed direction of critical care for this patient from another provider in my specialty: no     (including critical care time)  Medications Ordered in ED Medications  lactated ringers infusion (has no administration in time range)  acetaminophen (TYLENOL) tablet 650 mg (650 mg Oral Given 03/12/18 1309)  lactated ringers bolus 1,000 mL (0 mLs Intravenous Stopped 03/12/18 1911)  cefTRIAXone (ROCEPHIN) 1 g in sodium chloride 0.9 % 100 mL IVPB (0 g Intravenous Stopped 03/12/18 2149)  iohexol (OMNIPAQUE) 300 MG/ML solution 100 mL (100 mLs Intravenous Contrast Given 03/12/18 1850)  ondansetron (ZOFRAN) injection 4 mg (4 mg Intravenous Given 03/12/18 2149)  ketorolac (TORADOL) 15 MG/ML injection 15 mg (15 mg Intravenous Given 03/12/18 2148)  acetaminophen (TYLENOL) tablet 1,000 mg (1,000 mg Oral Given 03/12/18 2147)     Initial Impression / Assessment and Plan / ED Course  I have reviewed the triage vital signs and the nursing notes.  Pertinent labs & imaging results that were available during my care of the patient were reviewed by me and considered in my medical decision making (see chart for details).     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  Patient with suprapubic abdominal pain for the last 2 days.  She has had pain with urination.  She has had some bleeding from her vagina but has had previous hysterectomy.  Patient has had some nausea but no vomiting.  She  has had fever, body aches.  She currently finished clindamycin for dental abscess recently.  Does not have any dental pain, facial swelling.  Has suprapubic abdominal pain on exam.  There is no active bleeding on her speculum exam.   Patient is febrile, tachycardic and concern for sepsis.  Likely urinary source.  Will obtain flu testing, chest x-ray, CT abdomen and pelvis.  Patient to be given fluid bolus, Tylenol.  Patient with normal lactic acid.  No significant leukocytosis.  Urinalysis appears to be infected.  CT of the abdomen and pelvis did not show any acute findings.  Flu test was negative.  Chest x-ray did not show any signs of pneumonia, pneumothorax, pleural effusion.  Patient continued to be febrile and tachycardic and concern for sepsis.  Blood cultures were drawn, patient was given IV Rocephin.  She was started on maintenance fluids.  Repeat lactic acid was drawn and it was normal.  Given recent dental infection will obtain CT of the face to rule out other sources of infection. However, no signs of ongoing infection on exam, no ludwigs.  Patient to be admitted to hospitalist.  CT scan pending at time of admission.  Remained hemodynamically stable throughout my care.  This chart was dictated using voice recognition software.  Despite best efforts to proofread,  errors can occur which can change the documentation meaning.   Final Clinical Impressions(s) / ED Diagnoses   Final diagnoses:  Sepsis, due to unspecified organism, unspecified whether acute organ dysfunction present [facility])  Acute cystitis without hematuria    ED Discharge Orders    None       Lennice Sites, DO 03/12/18 2330

## 2018-03-12 NOTE — ED Notes (Signed)
Patient transported to X-ray 

## 2018-03-13 ENCOUNTER — Other Ambulatory Visit: Payer: Self-pay

## 2018-03-13 ENCOUNTER — Encounter (HOSPITAL_COMMUNITY): Payer: Self-pay | Admitting: Internal Medicine

## 2018-03-13 DIAGNOSIS — N3000 Acute cystitis without hematuria: Secondary | ICD-10-CM

## 2018-03-13 DIAGNOSIS — K047 Periapical abscess without sinus: Secondary | ICD-10-CM | POA: Diagnosis present

## 2018-03-13 DIAGNOSIS — I10 Essential (primary) hypertension: Secondary | ICD-10-CM

## 2018-03-13 DIAGNOSIS — R6510 Systemic inflammatory response syndrome (SIRS) of non-infectious origin without acute organ dysfunction: Secondary | ICD-10-CM

## 2018-03-13 LAB — CBC WITH DIFFERENTIAL/PLATELET
Abs Immature Granulocytes: 0.02 10*3/uL (ref 0.00–0.07)
Basophils Absolute: 0 10*3/uL (ref 0.0–0.1)
Basophils Relative: 0 %
Eosinophils Absolute: 0 10*3/uL (ref 0.0–0.5)
Eosinophils Relative: 0 %
HCT: 34.1 % — ABNORMAL LOW (ref 36.0–46.0)
Hemoglobin: 11.1 g/dL — ABNORMAL LOW (ref 12.0–15.0)
Immature Granulocytes: 0 %
Lymphocytes Relative: 21 %
Lymphs Abs: 1.1 10*3/uL (ref 0.7–4.0)
MCH: 32 pg (ref 26.0–34.0)
MCHC: 32.6 g/dL (ref 30.0–36.0)
MCV: 98.3 fL (ref 80.0–100.0)
MONOS PCT: 6 %
Monocytes Absolute: 0.3 10*3/uL (ref 0.1–1.0)
Neutro Abs: 3.7 10*3/uL (ref 1.7–7.7)
Neutrophils Relative %: 73 %
Platelets: 222 10*3/uL (ref 150–400)
RBC: 3.47 MIL/uL — ABNORMAL LOW (ref 3.87–5.11)
RDW: 13.2 % (ref 11.5–15.5)
WBC: 5.1 10*3/uL (ref 4.0–10.5)
nRBC: 0 % (ref 0.0–0.2)

## 2018-03-13 LAB — COMPREHENSIVE METABOLIC PANEL
ALT: 12 U/L (ref 0–44)
AST: 11 U/L — ABNORMAL LOW (ref 15–41)
Albumin: 2.7 g/dL — ABNORMAL LOW (ref 3.5–5.0)
Alkaline Phosphatase: 41 U/L (ref 38–126)
Anion gap: 6 (ref 5–15)
BUN: 10 mg/dL (ref 6–20)
CO2: 28 mmol/L (ref 22–32)
Calcium: 9.5 mg/dL (ref 8.9–10.3)
Chloride: 108 mmol/L (ref 98–111)
Creatinine, Ser: 0.75 mg/dL (ref 0.44–1.00)
GFR calc Af Amer: >60 mL/min (ref >60)
GFR calc non Af Amer: >60 mL/min (ref >60)
GLUCOSE: 170 mg/dL — AB (ref 70–99)
Potassium: 3.4 mmol/L — ABNORMAL LOW (ref 3.5–5.1)
SODIUM: 142 mmol/L (ref 135–145)
Total Bilirubin: 0.6 mg/dL (ref 0.3–1.2)
Total Protein: 5.3 g/dL — ABNORMAL LOW (ref 6.5–8.1)

## 2018-03-13 LAB — GLUCOSE, CAPILLARY
GLUCOSE-CAPILLARY: 137 mg/dL — AB (ref 70–99)
GLUCOSE-CAPILLARY: 161 mg/dL — AB (ref 70–99)
Glucose-Capillary: 133 mg/dL — ABNORMAL HIGH (ref 70–99)
Glucose-Capillary: 132 mg/dL — ABNORMAL HIGH (ref 70–99)
Glucose-Capillary: 139 mg/dL — ABNORMAL HIGH (ref 70–99)

## 2018-03-13 LAB — HIV ANTIBODY (ROUTINE TESTING W REFLEX): HIV SCREEN 4TH GENERATION: NONREACTIVE

## 2018-03-13 MED ORDER — SODIUM CHLORIDE 0.9 % IV SOLN
2.0000 g | INTRAVENOUS | Status: DC
  Administered 2018-03-13: 2 g via INTRAVENOUS
  Filled 2018-03-13 (×2): qty 20

## 2018-03-13 MED ORDER — SODIUM CHLORIDE 0.9 % IV SOLN
1.0000 g | INTRAVENOUS | Status: DC
  Filled 2018-03-13: qty 10

## 2018-03-13 MED ORDER — ONDANSETRON HCL 4 MG/2ML IJ SOLN
4.0000 mg | Freq: Four times a day (QID) | INTRAMUSCULAR | Status: DC | PRN
  Administered 2018-03-15: 4 mg via INTRAVENOUS
  Filled 2018-03-13: qty 2

## 2018-03-13 MED ORDER — ACETAMINOPHEN 325 MG PO TABS
650.0000 mg | ORAL_TABLET | Freq: Four times a day (QID) | ORAL | Status: DC | PRN
  Administered 2018-03-15 (×2): 650 mg via ORAL
  Filled 2018-03-13 (×2): qty 2

## 2018-03-13 MED ORDER — INSULIN ASPART 100 UNIT/ML ~~LOC~~ SOLN: 0.0000 [IU] | Freq: Three times a day (TID) | SUBCUTANEOUS | Status: DC

## 2018-03-13 MED ORDER — LOSARTAN POTASSIUM 50 MG PO TABS: 100.0000 mg | ORAL_TABLET | Freq: Every day | ORAL | Status: DC

## 2018-03-13 MED ORDER — LACTATED RINGERS IV SOLN
INTRAVENOUS | Status: AC
  Administered 2018-03-13 (×2): via INTRAVENOUS

## 2018-03-13 MED ORDER — ACETAMINOPHEN 650 MG RE SUPP: 650.0000 mg | Freq: Four times a day (QID) | RECTAL | Status: DC | PRN

## 2018-03-13 MED ORDER — POTASSIUM CHLORIDE CRYS ER 20 MEQ PO TBCR
40.0000 meq | EXTENDED_RELEASE_TABLET | Freq: Once | ORAL | Status: AC
  Administered 2018-03-13: 40 meq via ORAL
  Filled 2018-03-13: qty 2

## 2018-03-13 MED ORDER — ONDANSETRON HCL 4 MG PO TABS: 4.0000 mg | ORAL_TABLET | Freq: Four times a day (QID) | ORAL | Status: DC | PRN

## 2018-03-13 MED ORDER — METRONIDAZOLE IN NACL 5-0.79 MG/ML-% IV SOLN
500.0000 mg | Freq: Three times a day (TID) | INTRAVENOUS | Status: DC
  Administered 2018-03-13 – 2018-03-14 (×5): 500 mg via INTRAVENOUS
  Filled 2018-03-13 (×5): qty 100

## 2018-03-13 MED ORDER — INSULIN ASPART 100 UNIT/ML ~~LOC~~ SOLN
0.0000 [IU] | Freq: Three times a day (TID) | SUBCUTANEOUS | Status: DC
  Administered 2018-03-13: 2 [IU] via SUBCUTANEOUS
  Administered 2018-03-13 (×2): 1 [IU] via SUBCUTANEOUS
  Administered 2018-03-14: 2 [IU] via SUBCUTANEOUS
  Administered 2018-03-14 – 2018-03-16 (×6): 1 [IU] via SUBCUTANEOUS

## 2018-03-13 NOTE — H&P (Signed)
History and Physical    Kristi Russell ZJQ:[id number] DOB: 01/26/1961 DOA: 03/12/2018  PCP: Mack Hook, MD  Patient coming from: Home.  Chief Complaint: Abdominal pain and vaginal spotting.  HPI: Kristi Russell is a 58 y.o. female with history of left-sided paralysis since birth, hypertension, prediabetes presents to the ER because of increasing pain in the left lower quadrant with some vaginal spotting noticed over the last 1 week.  Has been having subjective feeling of fever chills.  Denies vomiting or diarrhea had some nausea.  Had come to the ER 2 weeks ago with left facial swelling and at that time patient had CAT scan done and showed some abscess which was drained in the ER and sent home.  Patient states the left facial swelling has declined but feels some not around the left gingival area.  ED Course: In the ER patient was tachycardic with temperature 102 F UA is consistent with UTI.  CT abdomen and pelvis was unremarkable.  Patient did complain of some vaginal spotting but has had hysterectomy.  Per the ER patient pelvic exam was unremarkable.  Blood cultures urine cultures were sent and since patient was febrile tachycardic concerning for developing sepsis started on ceftriaxone.  Since patient has had recent gingival abscess CT maxillofacial was done which shows left maxillary periosteal abscess.  Review of Systems: As per HPI, rest all negative.   Past Medical History:  Diagnosis Date  . 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 Headache    "weekly" (03/10/2017)  . Hypertension 1990s  . 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 Truman Hayward on [place].  Could not afford to go back for treatment.    Past Surgical History:  Procedure Laterality Date  . ABDOMINAL HYSTERECTOMY  2009   She thinks TAH/BSO:  [place], [place].  Dr. Lacinda Axon  . CESAREAN SECTION  1996  . LAPAROTOMY  N/A 03/09/2017   Procedure: EXPLOR LAP, SMALL BOWEL RESECTION W/ ANASTOMOSIS, LYSIS OF ADHESIONS;  Surgeon: Kinsinger, Arta Bruce, MD;  Location: [place];  Service: General;  Laterality: N/A;     reports that she has been smoking cigarettes. She has a 19.00 pack-year smoking history. She has never used smokeless tobacco. She reports current alcohol use. She reports that she does not use drugs.  No Known Allergies  Family History  Problem Relation Age of Onset  . Heart disease Mother   . Hypertension Mother   . Stroke Mother        Sudden cardiac arrest vs. stroke  . Diabetes Sister   . Hypertension Sister   . Kidney disease Brother        on Dialysis  . Hypertension Daughter   . Obstructive Sleep Apnea Brother   . Breast cancer Maternal Grandmother     Prior to Admission medications   Medication Sig Start Date End Date Taking? Authorizing Provider  losartan-hydrochlorothiazide [facility] Penta) 100-25 MG tablet 1 tab by mouth daily 07/15/17  Yes Mack Hook, MD  artificial tears (LACRILUBE) OINT ophthalmic ointment Place ribbon in left eye and patch closed before bedtime daily Patient not taking: Reported on 03/09/2017 02/24/17   Mack Hook, MD  cetirizine (ZYRTEC) 10 MG tablet Take 1 tablet (10 mg total) by mouth daily. Patient not taking: Reported on 07/10/2017 02/24/17   Mack Hook, MD  clindamycin (CLEOCIN) 150 MG capsule Take 3 capsules (450 mg total) by mouth  3 (three) times daily. Patient not taking: Reported on 03/12/2018 02/27/18   Petrucelli, Glynda Jaeger, PA-C  HYDROcodone-acetaminophen (NORCO/VICODIN) 5-325 MG tablet Take 1 tablet by mouth every 6 (six) hours as needed for severe pain. Patient not taking: Reported on 03/12/2018 02/20/18   Drenda Freeze, MD  losartan-hydrochlorothiazide [facility]) 50-12.5 MG tablet 2 tabs by mouth once daily Patient not taking: Reported on 03/12/2018 07/10/17   Mack Hook, MD  mometasone (NASONEX) 50 MCG/ACT nasal spray 2  sprays each nostril daily Patient not taking: Reported on 03/09/2017 02/24/17   Mack Hook, MD  naproxen (NAPROSYN) 500 MG tablet Take 1 tablet (500 mg total) by mouth 2 (two) times daily. Patient not taking: Reported on 03/12/2018 02/27/18   Petrucelli, Glynda Jaeger, PA-C  varenicline (CHANTIX CONTINUING MONTH PAK) 1 MG tablet Take 1 tablet (1 mg total) by mouth 2 (two) times daily. Patient not taking: Reported on 04/04/2017 02/24/17   Mack Hook, MD  varenicline (CHANTIX STARTING MONTH PAK) 0.5 MG X 11 & 1 MG X 42 tablet Take one 0.5 mg tablet by mouth once daily for 3 days, then increase to one 0.5 mg tablet twice daily for 4 days, then increase to one 1 mg tablet twice daily. Patient not taking: Reported on 04/04/2017 02/24/17   Mack Hook, MD    Physical Exam: Vitals:   03/13/18 0030 03/13/18 0115 03/13/18 0130 03/13/18 0219  BP: 98/64 104/66 96/64 92/64   Pulse: 100 97 95 85  Resp: (!) 22 (!) 22 (!) 21 20  Temp:    98.4 F (36.9 C)  TempSrc:    Oral  SpO2: 95% 93% 91% 95%  Weight:      Height:          Constitutional: Moderately built and nourished. Vitals:   03/13/18 0030 03/13/18 0115 03/13/18 0130 03/13/18 0219  BP: 98/64 104/66 96/64 92/64   Pulse: 100 97 95 85  Resp: (!) 22 (!) 22 (!) 21 20  Temp:    98.4 F (36.9 C)  TempSrc:    Oral  SpO2: 95% 93% 91% 95%  Weight:      Height:       Eyes: Anicteric no pallor. ENMT: Mild tenderness on the left maxillary area. Neck: No neck rigidity no mass felt. Respiratory: No rhonchi or crepitations. Cardiovascular: S1-S2 heard. Abdomen: Soft nontender bowel sounds present. Musculoskeletal: No edema.  No joint effusion. Skin: No rash. Neurologic: Alert awake oriented to time place and person.  Has left-sided weakness from birth injury. Psychiatric: Appears normal.   Labs on Admission: I have personally reviewed following labs and imaging studies  CBC: Recent Labs  Lab 03/12/18 1324  WBC 5.7    NEUTROABS 5.0  HGB 13.1  HCT 41.7  MCV 98.1  PLT 676   Basic Metabolic Panel: Recent Labs  Lab 03/12/18 1324  NA 139  K 3.9  CL 105  CO2 26  GLUCOSE 136*  BUN 9  CREATININE 0.67  CALCIUM 10.4*   GFR: Estimated Creatinine Clearance: 78.8 mL/min (by C-G formula based on SCr of 0.67 mg/dL). Liver Function Tests: Recent Labs  Lab 03/12/18 1324  AST 36  ALT 17  ALKPHOS 55  BILITOT 0.8  PROT 6.8  ALBUMIN 3.6   Recent Labs  Lab 03/12/18 1324  LIPASE 21   No results for input(s): AMMONIA in the last 168 hours. Coagulation Profile: No results for input(s): INR, PROTIME in the last 168 hours. Cardiac Enzymes: No results for input(s): CKTOTAL, CKMB, CKMBINDEX, TROPONINI  in the last 168 hours. BNP (last 3 results) No results for input(s): PROBNP in the last 8760 hours. HbA1C: No results for input(s): HGBA1C in the last 72 hours. CBG: Recent Labs  Lab 03/13/18 0311  GLUCAP 137*   Lipid Profile: No results for input(s): CHOL, HDL, LDLCALC, TRIG, CHOLHDL, LDLDIRECT in the last 72 hours. Thyroid Function Tests: No results for input(s): TSH, T4TOTAL, FREET4, T3FREE, THYROIDAB in the last 72 hours. Anemia Panel: No results for input(s): VITAMINB12, FOLATE, FERRITIN, TIBC, IRON, RETICCTPCT in the last 72 hours. Urine analysis:    Component Value Date/Time   COLORURINE YELLOW 03/12/2018 1743   APPEARANCEUR CLOUDY (A) 03/12/2018 1743   LABSPEC 1.016 03/12/2018 1743   PHURINE 5.0 03/12/2018 1743   GLUCOSEU NEGATIVE 03/12/2018 1743   HGBUR LARGE (A) 03/12/2018 1743   BILIRUBINUR NEGATIVE 03/12/2018 1743   KETONESUR 5 (A) 03/12/2018 1743   PROTEINUR 30 (A) 03/12/2018 1743   UROBILINOGEN 1.0 11/14/2012 1146   NITRITE POSITIVE (A) 03/12/2018 1743   LEUKOCYTESUR LARGE (A) 03/12/2018 1743   Sepsis Labs: @LABRCNTIP (procalcitonin:4,lacticidven:4) )No results found for this or any previous visit (from the past 240 hour(s)).   Radiological Exams on Admission: Dg Chest  2 View  Result Date: 03/12/2018 CLINICAL DATA:  Fever. Patient reports abdominal and back pain. EXAM: CHEST - 2 VIEW COMPARISON:  Chest radiograph 03/10/2017 FINDINGS: Unchanged heart size and mediastinal contours with borderline cardiomegaly. No pulmonary edema, confluent airspace disease, pleural effusion or pneumothorax. No acute osseous abnormalities are seen. IMPRESSION: No acute pulmonary process. Electronically Signed   By: Keith Rake M.D.   On: 03/12/2018 19:07   Ct Abdomen Pelvis W Contrast  Result Date: 03/12/2018 CLINICAL DATA:  Pt reports abd pain, back pain and loss of appetite for the past 2 weeks. Also reports vaginal bleeding when she wipes for 2 weeks. Denies heavy bleeding but states she has had a hysterectomy EXAM: CT ABDOMEN AND PELVIS WITH CONTRAST TECHNIQUE: Multidetector CT imaging of the abdomen and pelvis was performed using the standard protocol following bolus administration of intravenous contrast. CONTRAST:  177mL OMNIPAQUE IOHEXOL 300 MG/ML  SOLN COMPARISON:  CT, 03/09/2017 FINDINGS: Lower chest: No acute abnormality. Hepatobiliary: No focal liver abnormality is seen. No gallstones, gallbladder wall thickening, or biliary dilatation. Pancreas: Unremarkable. No pancreatic ductal dilatation or surrounding inflammatory changes. Spleen: Normal in size without focal abnormality. Adrenals/Urinary Tract: 12 mm left adrenal nodule, stable. Normal right adrenal gland. Kidneys normal size, orientation and position with symmetric enhancement and excretion. No masses, stones or hydronephrosis. Ureters are normal in course and in caliber. Bladder is unremarkable. Stomach/Bowel: Small bowel anastomosis staple line in the right anterior pelvis. No bowel dilation, wall thickening or adjacent inflammation. Stomach is unremarkable. Normal appendix visualized. Vascular/Lymphatic: Mild aortic atherosclerosis. No other vascular abnormality. No adenopathy. Reproductive: Status post hysterectomy. No  adnexal masses. Other: No hernia. Hernia noted on the prior CT has been repaired. No ascites. Musculoskeletal: No acute or significant osseous findings. IMPRESSION: 1. No acute findings within the abdomen or pelvis. 2. 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 3. Status post hysterectomy and small bowel anastomosis. 4. Mild aortic atherosclerosis. Electronically Signed   By: Lajean Manes M.D.   On: 03/12/2018 19:40   Ct Maxillofacial W Contrast  Result Date: 03/12/2018 CLINICAL DATA:  Fever and dental pain EXAM: CT MAXILLOFACIAL WITH CONTRAST TECHNIQUE: Multidetector CT imaging of the maxillofacial structures was performed with intravenous contrast. Multiplanar CT image reconstructions were also generated. CONTRAST:  87mL OMNIPAQUE  IOHEXOL 300 MG/ML  SOLN COMPARISON:  Facial CT 02/27/2018 FINDINGS: Osseous: No facial fracture. Dental: Poor dentition with multiple large periapical lucencies worst at the roots of the bilateral maxillary and mandibular molars and premolars. There is left facial soft tissue swelling that is in close proximity to periapical lucencies of teeth 13 and 14. Small subperiosteal abscess measures approximately 7 x 5 mm. Orbits: The globes are intact. Normal appearance of the intra- and extraconal fat. Symmetric extraocular muscles. Sinuses: No fluid levels or advanced mucosal thickening. Soft tissues: Normal visualized extracranial soft tissues. Limited intracranial: Normal. IMPRESSION: 1. Odontogenic cellulitis and subcentimeter subperiosteal abscess at the left maxilla, likely originating from periapical lucency at the root of teeth 13 and 14. 2. Severely poor dentition with multiple large periapical lucencies, worst at the roots of the bilateral maxillary and mandibular molars and premolars. Electronically Signed   By: Ulyses Jarred M.D.   On: 03/12/2018 23:23      Assessment/Plan Principal Problem:   SIRS (systemic inflammatory response syndrome) (HCC) Active Problems:    Hypertension   Acute cystitis without hematuria   Tooth abscess    1. SIRS from possible developing sepsis secondary to UTI and also left maxillary periosteal abscess.  Patient has been placed on ceftriaxone and Flagyl.  Follow cultures.  Continue hydration.  May have to discuss with ENT or general surgeon in the morning regarding the periosteal abscess. 2. Hypertension on losartan and hydrochlorothiazide.  Since patient is getting hydration and holding hydrochlorothiazide continue losartan.  Follow metabolic panel. 3. History of prediabetes on metformin.  I have kept patient on sliding scale coverage. 4. History of birth defect with left-sided hemiplegia. 5. Tobacco abuse -advised to quit smoking.   DVT prophylaxis: SCDs for now.  If no procedures anticipated may change to Lovenox. Code Status: Full code. Family Communication: Discussed with patient. Disposition Plan: Home. Consults called: None. Admission status: Observation.   Rise Patience MD [facility] Pager [phone].  If 7PM-7AM, please contact night-coverage [email or web address] Password [facility]  03/13/2018, 3:58 AM

## 2018-03-13 NOTE — Progress Notes (Signed)
[facility] PLAN OF CARE NOTE Patient: Kristi Russell PYY:[id number]   PCP: Mack Hook, MD DOB: 03-21-1960   DOA: 03/12/2018   DOS: 03/13/2018    Patient was admitted by my colleague Dr. Hal Hope  earlier on 03/13/2018. I have reviewed the H&P as well as assessment and plan and agree with the same. Important changes in the plan are listed below.  Plan of care: Principal Problem:   SIRS (systemic inflammatory response syndrome) (HCC) Active Problems:   Hypertension   Acute cystitis without hematuria   Tooth abscess Attempted to reach office of dentistry, still not available from holidays. We will try to reach the office on Monday.  Author: Berle Mull, MD [facility] Pager: [phone] 03/13/2018 7:52 PM   If 7PM-7AM, please contact night-coverage at [email or web address], password [facility]

## 2018-03-14 LAB — GLUCOSE, CAPILLARY
Glucose-Capillary: 147 mg/dL — ABNORMAL HIGH (ref 70–99)
Glucose-Capillary: 137 mg/dL — ABNORMAL HIGH (ref 70–99)
Glucose-Capillary: 160 mg/dL — ABNORMAL HIGH (ref 70–99)
Glucose-Capillary: 119 mg/dL — ABNORMAL HIGH (ref 70–99)

## 2018-03-14 LAB — BASIC METABOLIC PANEL
Anion gap: 3 — ABNORMAL LOW (ref 5–15)
BUN: 8 mg/dL (ref 6–20)
CO2: 25 mmol/L (ref 22–32)
Calcium: 9.5 mg/dL (ref 8.9–10.3)
Chloride: 112 mmol/L — ABNORMAL HIGH (ref 98–111)
Creatinine, Ser: 0.68 mg/dL (ref 0.44–1.00)
GFR calc Af Amer: >60 mL/min (ref >60)
Glucose, Bld: 146 mg/dL — ABNORMAL HIGH (ref 70–99)
Potassium: 4.1 mmol/L (ref 3.5–5.1)
Sodium: 140 mmol/L (ref 135–145)

## 2018-03-14 LAB — CBC
HCT: 34 % — ABNORMAL LOW (ref 36.0–46.0)
Hemoglobin: 10.9 g/dL — ABNORMAL LOW (ref 12.0–15.0)
MCH: 31.9 pg (ref 26.0–34.0)
MCHC: 32.1 g/dL (ref 30.0–36.0)
MCV: 99.4 fL (ref 80.0–100.0)
Platelets: 214 10*3/uL (ref 150–400)
RBC: 3.42 MIL/uL — ABNORMAL LOW (ref 3.87–5.11)
RDW: 13.1 % (ref 11.5–15.5)
WBC: 5.1 10*3/uL (ref 4.0–10.5)
nRBC: 0 % (ref 0.0–0.2)

## 2018-03-14 LAB — URINE CULTURE: Culture: >=100000 — AB

## 2018-03-14 LAB — MAGNESIUM: MAGNESIUM: 2.1 mg/dL (ref 1.7–2.4)

## 2018-03-14 MED ORDER — METRONIDAZOLE 500 MG PO TABS
500.0000 mg | ORAL_TABLET | Freq: Three times a day (TID) | ORAL | Status: DC
  Administered 2018-03-14 – 2018-03-16 (×7): 500 mg via ORAL
  Filled 2018-03-14 (×7): qty 1

## 2018-03-14 MED ORDER — CEFDINIR 300 MG PO CAPS
300.0000 mg | ORAL_CAPSULE | Freq: Two times a day (BID) | ORAL | Status: DC
  Administered 2018-03-14 – 2018-03-16 (×5): 300 mg via ORAL
  Filled 2018-03-14 (×5): qty 1

## 2018-03-14 MED ORDER — LACTATED RINGERS IV SOLN
INTRAVENOUS | Status: DC
  Administered 2018-03-14 – 2018-03-15 (×2): via INTRAVENOUS

## 2018-03-14 NOTE — Progress Notes (Signed)
[facility] Progress Note  Patient: Kristi Russell WHQ:[id number]   PCP: Mack Hook, MD DOB: 1961/02/14   DOA: 03/12/2018   DOS: 03/14/2018   Date of Service: the patient was seen and examined on 03/14/2018  Brief hospital course: Pt. with PMH of  left-sided paralysis since birth, hypertension, prediabetes; admitted on 03/12/2018, presented with complaint of abdominal pain, was found to have UTI. Currently further plan is continue antibiotics.  Subjective: Feeling better "occasionally dizziness.  No nausea no vomiting.  Assessment and Plan: 1. SIRS from possible developing sepsis secondary to UTI and also left maxillary periosteal abscess.  Patient has been placed on ceftriaxone and Flagyl.    Transition to oral.  Follow cultures.  Continue hydration.    Dentistry not available.  Will discuss with ENT tomorrow. 2. Hypertension on losartan and hydrochlorothiazide.  Since patient is getting hydration and holding hydrochlorothiazide and losartan.  Follow metabolic panel. 3. History of prediabetes on metformin.  I have kept patient on sliding scale coverage. 4. History of birth defect with left-sided hemiplegia. 5. Tobacco abuse -advised to quit smoking.  Diet: cardiac die DVT Prophylaxis: subcutaneous Heparin  Advance goals of care discussion: full code  Family Communication: no family was present at bedside, at the time of interview.   Disposition:  Discharge to home.  Consultants: none Procedures: noen  Scheduled Meds: . cefdinir  300 mg Oral Q12H  . insulin aspart  0-9 Units Subcutaneous TID WC  . metroNIDAZOLE  500 mg Oral Q8H   Continuous Infusions: . lactated ringers 75 mL/hr at 03/14/18 1151   PRN Meds: acetaminophen **OR** acetaminophen, ondansetron **OR** ondansetron (ZOFRAN) IV Antibiotics: Anti-infectives (From admission, onward)   Start     Dose/Rate Route Frequency Ordered Stop   03/14/18 1400  metroNIDAZOLE (FLAGYL) tablet 500 mg     500 mg Oral Every 8  hours 03/14/18 1209     03/14/18 1245  cefdinir (OMNICEF) capsule 300 mg     300 mg Oral Every 12 hours 03/14/18 1209     03/13/18 1400  cefTRIAXone (ROCEPHIN) 1 g in sodium chloride 0.9 % 100 mL IVPB  Status:  Discontinued     1 g 200 mL/hr over 30 Minutes Intravenous Every 24 hours 03/13/18 0359 03/13/18 1130   03/13/18 1400  cefTRIAXone (ROCEPHIN) 2 g in sodium chloride 0.9 % 100 mL IVPB  Status:  Discontinued     2 g 200 mL/hr over 30 Minutes Intravenous Every 24 hours 03/13/18 1130 03/14/18 1155   03/13/18 0400  metroNIDAZOLE (FLAGYL) IVPB 500 mg  Status:  Discontinued     500 mg 100 mL/hr over 60 Minutes Intravenous Every 8 hours 03/13/18 0359 03/14/18 1155   03/12/18 1845  cefTRIAXone (ROCEPHIN) 1 g in sodium chloride 0.9 % 100 mL IVPB     1 g 200 mL/hr over 30 Minutes Intravenous  Once 03/12/18 1837 03/12/18 2149       Objective: Physical Exam: Vitals:   03/13/18 2325 03/14/18 0331 03/14/18 0724 03/14/18 1728  BP: 120/73  133/73 124/77  Pulse: 82  72 81  Resp: 18  15 14   Temp: 98.5 F (36.9 C)  98.5 F (36.9 C) 98.9 F (37.2 C)  TempSrc: Oral  Oral Oral  SpO2: 94%  95% 99%  Weight:  89 kg    Height:        Intake/Output Summary (Last 24 hours) at 03/14/2018 1853 Last data filed at 03/14/2018 0930 Gross per 24 hour  Intake 220 ml  Output -  Net 220 ml   Filed Weights   03/12/18 1258 03/13/18 0516 03/14/18 0331  Weight: 86.2 kg 88 kg 89 kg   General: Alert, Awake and Oriented to Time, Place and Person. Appear in mild distress, affect appropriate Eyes: PERRL, Conjunctiva normal ENT: Oral Mucosa clear moist. Neck: no JVD, no Abnormal Mass Or lumps Cardiovascular: S1 and S2 Present, no Murmur, Peripheral Pulses Present Respiratory: normal respiratory effort, Bilateral Air entry equal and Decreased, no use of accessory muscle, Clear to Auscultation, no Crackles, no wheezes Abdomen: Bowel Sound present, Soft and no tenderness, no hernia Skin: no redness, non Rash, no  induration Extremities: no Pedal edema, no calf tenderness Neurologic: Grossly no focal neuro deficit. Bilaterally Equal motor strength  Data Reviewed: CBC: Recent Labs  Lab 03/12/18 1324 03/13/18 0652 03/14/18 1139  WBC 5.7 5.1 5.1  NEUTROABS 5.0 3.7  --   HGB 13.1 11.1* 10.9*  HCT 41.7 34.1* 34.0*  MCV 98.1 98.3 99.4  PLT 268 222 400   Basic Metabolic Panel: Recent Labs  Lab 03/12/18 1324 03/13/18 0652 03/14/18 1139  NA 139 142 140  K 3.9 3.4* 4.1  CL 105 108 112*  CO2 26 28 25   GLUCOSE 136* 170* 146*  BUN 9 10 8   CREATININE 0.67 0.75 0.68  CALCIUM 10.4* 9.5 9.5  MG  --   --  2.1    Liver Function Tests: Recent Labs  Lab 03/12/18 1324 03/13/18 0652  AST 36 11*  ALT 17 12  ALKPHOS 55 41  BILITOT 0.8 0.6  PROT 6.8 5.3*  ALBUMIN 3.6 2.7*   Recent Labs  Lab 03/12/18 1324  LIPASE 21   No results for input(s): AMMONIA in the last 168 hours. Coagulation Profile: No results for input(s): INR, PROTIME in the last 168 hours. Cardiac Enzymes: No results for input(s): CKTOTAL, CKMB, CKMBINDEX, TROPONINI in the last 168 hours. BNP (last 3 results) No results for input(s): PROBNP in the last 8760 hours. CBG: Recent Labs  Lab 03/13/18 1725 03/13/18 2126 03/14/18 0725 03/14/18 1216 03/14/18 1729  GLUCAP 132* 139* 147* 137* 160*   Studies: No results found.   Time spent: 35 minutes  Author: Berle Mull, MD [facility] Pager: [phone] 03/14/2018 6:53 PM  Between 7PM-7AM, please contact night-coverage at [email or web address], password [facility]

## 2018-03-15 LAB — GLUCOSE, CAPILLARY
GLUCOSE-CAPILLARY: 147 mg/dL — AB (ref 70–99)
Glucose-Capillary: 127 mg/dL — ABNORMAL HIGH (ref 70–99)
Glucose-Capillary: 126 mg/dL — ABNORMAL HIGH (ref 70–99)
Glucose-Capillary: 110 mg/dL — ABNORMAL HIGH (ref 70–99)

## 2018-03-15 NOTE — Plan of Care (Signed)

## 2018-03-15 NOTE — Progress Notes (Signed)
[facility] Progress Note  Patient: Kristi Russell UJW:[id number]   PCP: No primary care provider on file. DOB: 12/27/1960   DOA: 03/12/2018   DOS: 03/15/2018   Date of Service: the patient was seen and examined on 03/15/2018  Brief hospital course: Pt. with PMH of  left-sided paralysis since birth, hypertension, prediabetes; admitted on 03/12/2018, presented with complaint of abdominal pain, was found to have UTI. Currently further plan is continue antibiotics.  Subjective: No nausea no vomiting.  Reports some diarrhea.  No abdominal pain.  No fever no chills.  Assessment and Plan: 1. sepsis secondary to UTI and also left maxillary periosteal abscess. Patient has been placed on ceftriaxone and Flagyl.    Transition to oral.  Follow cultures.    Stop hydration.    Dentistry not available.  Will discuss tomorrow to ensure definitive treatment for source control as the patient has 3 ER visit for worsening infection. 2. Hypertension on losartan and hydrochlorothiazide.  Since patient is getting hydration and holding hydrochlorothiazide and losartan.  Follow metabolic panel. 3. History of prediabetes on metformin. on sliding scale coverage. 4. History of birth defect with left-sided hemiplegia. 5. Tobacco abuse -advised to quit smoking.  Diet: carb modified diet DVT Prophylaxis: subcutaneous Heparin  Advance goals of care discussion: full code  Family Communication: no family was present at bedside, at the time of interview.   Disposition:  Discharge to home.  Consultants: none Procedures: noen  Scheduled Meds: . cefdinir  300 mg Oral Q12H  . insulin aspart  0-9 Units Subcutaneous TID WC  . metroNIDAZOLE  500 mg Oral Q8H   Continuous Infusions:  PRN Meds: acetaminophen **OR** acetaminophen, ondansetron **OR** ondansetron (ZOFRAN) IV Antibiotics: Anti-infectives (From admission, onward)   Start     Dose/Rate Route Frequency Ordered Stop   03/14/18 1400  metroNIDAZOLE (FLAGYL)  tablet 500 mg     500 mg Oral Every 8 hours 03/14/18 1209     03/14/18 1245  cefdinir (OMNICEF) capsule 300 mg     300 mg Oral Every 12 hours 03/14/18 1209     03/13/18 1400  cefTRIAXone (ROCEPHIN) 1 g in sodium chloride 0.9 % 100 mL IVPB  Status:  Discontinued     1 g 200 mL/hr over 30 Minutes Intravenous Every 24 hours 03/13/18 0359 03/13/18 1130   03/13/18 1400  cefTRIAXone (ROCEPHIN) 2 g in sodium chloride 0.9 % 100 mL IVPB  Status:  Discontinued     2 g 200 mL/hr over 30 Minutes Intravenous Every 24 hours 03/13/18 1130 03/14/18 1155   03/13/18 0400  metroNIDAZOLE (FLAGYL) IVPB 500 mg  Status:  Discontinued     500 mg 100 mL/hr over 60 Minutes Intravenous Every 8 hours 03/13/18 0359 03/14/18 1155   03/12/18 1845  cefTRIAXone (ROCEPHIN) 1 g in sodium chloride 0.9 % 100 mL IVPB     1 g 200 mL/hr over 30 Minutes Intravenous  Once 03/12/18 1837 03/12/18 2149       Objective: Physical Exam: Vitals:   03/14/18 2306 03/15/18 0600 03/15/18 0755 03/15/18 1657  BP: 130/68  (!) 127/91 (!) 141/74  Pulse: 78  71 87  Resp: 15  13 13   Temp: 98.7 F (37.1 C)  98.5 F (36.9 C) 99.1 F (37.3 C)  TempSrc: Oral  Oral Oral  SpO2: 98%  98% 97%  Weight:  91 kg    Height:       No intake or output data in the 24 hours ending 03/15/18 1852 Filed  Weights   03/13/18 0516 03/14/18 0331 03/15/18 0600  Weight: 88 kg 89 kg 91 kg   General: Alert, Awake and Oriented to Time, Place and Person. Appear in mild distress, affect appropriate Eyes: PERRL, Conjunctiva normal ENT: Oral Mucosa clear moist. Neck: no JVD, no Abnormal Mass Or lumps Cardiovascular: S1 and S2 Present, no Murmur, Peripheral Pulses Present Respiratory: normal respiratory effort, Bilateral Air entry equal and Decreased, no use of accessory muscle, Clear to Auscultation, no Crackles, no wheezes Abdomen: Bowel Sound present, Soft and no tenderness, no hernia Skin: no redness, non Rash, no induration Extremities: no Pedal edema, no  calf tenderness Neurologic: Grossly no focal neuro deficit. Bilaterally Equal motor strength  Data Reviewed: CBC: Recent Labs  Lab 03/12/18 1324 03/13/18 0652 03/14/18 1139  WBC 5.7 5.1 5.1  NEUTROABS 5.0 3.7  --   HGB 13.1 11.1* 10.9*  HCT 41.7 34.1* 34.0*  MCV 98.1 98.3 99.4  PLT 268 222 811   Basic Metabolic Panel: Recent Labs  Lab 03/12/18 1324 03/13/18 0652 03/14/18 1139  NA 139 142 140  K 3.9 3.4* 4.1  CL 105 108 112*  CO2 26 28 25   GLUCOSE 136* 170* 146*  BUN 9 10 8   CREATININE 0.67 0.75 0.68  CALCIUM 10.4* 9.5 9.5  MG  --   --  2.1    Liver Function Tests: Recent Labs  Lab 03/12/18 1324 03/13/18 0652  AST 36 11*  ALT 17 12  ALKPHOS 55 41  BILITOT 0.8 0.6  PROT 6.8 5.3*  ALBUMIN 3.6 2.7*   Recent Labs  Lab 03/12/18 1324  LIPASE 21   No results for input(s): AMMONIA in the last 168 hours. Coagulation Profile: No results for input(s): INR, PROTIME in the last 168 hours. Cardiac Enzymes: No results for input(s): CKTOTAL, CKMB, CKMBINDEX, TROPONINI in the last 168 hours. BNP (last 3 results) No results for input(s): PROBNP in the last 8760 hours. CBG: Recent Labs  Lab 03/14/18 1729 03/14/18 2121 03/15/18 0756 03/15/18 1220 03/15/18 1657  GLUCAP 160* 119* 127* 126* 110*   Studies: No results found.   Time spent: 35 minutes  Author: Berle Mull, MD [facility] Pager: [phone] 03/15/2018 6:52 PM  Between 7PM-7AM, please contact night-coverage at [email or web address], password [facility]

## 2018-03-16 ENCOUNTER — Inpatient Hospital Stay (HOSPITAL_COMMUNITY): Payer: Self-pay

## 2018-03-16 LAB — GLUCOSE, CAPILLARY
Glucose-Capillary: 127 mg/dL — ABNORMAL HIGH (ref 70–99)
Glucose-Capillary: 124 mg/dL — ABNORMAL HIGH (ref 70–99)

## 2018-03-16 MED ORDER — CEFDINIR 300 MG PO CAPS: 300.0000 mg | ORAL_CAPSULE | Freq: Two times a day (BID) | ORAL | 0 refills | Status: AC

## 2018-03-16 MED ORDER — METRONIDAZOLE 500 MG PO TABS: 500.0000 mg | ORAL_TABLET | Freq: Three times a day (TID) | ORAL | 0 refills | Status: AC

## 2018-03-16 NOTE — Progress Notes (Signed)
Patient ID: Kristi Russell, female   DOB: 08-13-1960, 58 y.o.   MRN: [id number] Reason for Consult: Dental abscess Referring Physician: Lavina Hamman, MD  Kristi Russell is an 58 y.o. female.  HPI: Kristi Russell evaluate patient with left maxillary dental abscess.  History of poor dental care over the years.  Having pain and swelling over the left upper premolar.  Past Medical History:  Diagnosis Date  . 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 Headache    "weekly" (03/10/2017)  . Hypertension 1990s  . 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 Truman Hayward on [place].  Could not afford to go back for treatment.    Past Surgical History:  Procedure Laterality Date  . ABDOMINAL HYSTERECTOMY  2009   She thinks TAH/BSO:  [place], [place].  Dr. Lacinda Axon  . CESAREAN SECTION  1996  . LAPAROTOMY N/A 03/09/2017   Procedure: EXPLOR LAP, SMALL BOWEL RESECTION W/ ANASTOMOSIS, LYSIS OF ADHESIONS;  Surgeon: Kinsinger, Arta Bruce, MD;  Location: [place];  Service: General;  Laterality: N/A;    Family History  Problem Relation Age of Onset  . Heart disease Mother   . Hypertension Mother   . Stroke Mother        Sudden cardiac arrest vs. stroke  . Diabetes Sister   . Hypertension Sister   . Kidney disease Brother        on Dialysis  . Hypertension Daughter   . Obstructive Sleep Apnea Brother   . Breast cancer Maternal Grandmother     Social History:  reports that she has been smoking cigarettes. She has a 19.00 pack-year smoking history. She has never used smokeless tobacco. She reports current alcohol use. She reports that she does not use drugs.  Allergies: No Known Allergies  Medications: Reviewed  Results for orders placed or performed during the hospital encounter of 03/12/18 (from the past 48 hour(s))  Glucose, capillary     Status: Abnormal   Collection Time: 03/14/18 12:16 PM  Result Value  Ref Range   Glucose-Capillary 137 (H) 70 - 99 mg/dL  Glucose, capillary     Status: Abnormal   Collection Time: 03/14/18  5:29 PM  Result Value Ref Range   Glucose-Capillary 160 (H) 70 - 99 mg/dL  Glucose, capillary     Status: Abnormal   Collection Time: 03/14/18  9:21 PM  Result Value Ref Range   Glucose-Capillary 119 (H) 70 - 99 mg/dL  Glucose, capillary     Status: Abnormal   Collection Time: 03/15/18  7:56 AM  Result Value Ref Range   Glucose-Capillary 127 (H) 70 - 99 mg/dL  Glucose, capillary     Status: Abnormal   Collection Time: 03/15/18 12:20 PM  Result Value Ref Range   Glucose-Capillary 126 (H) 70 - 99 mg/dL  Glucose, capillary     Status: Abnormal   Collection Time: 03/15/18  4:57 PM  Result Value Ref Range   Glucose-Capillary 110 (H) 70 - 99 mg/dL  Glucose, capillary     Status: Abnormal   Collection Time: 03/15/18  9:14 PM  Result Value Ref Range   Glucose-Capillary 147 (H) 70 - 99 mg/dL  Glucose, capillary     Status: Abnormal   Collection Time: 03/16/18  7:27 AM  Result Value Ref Range   Glucose-Capillary 127 (H) 70 - 99 mg/dL    Dg Orthopantogram  Result Date: 03/16/2018  CLINICAL DATA:  Swelling of the left face. Dental caries. Infection. EXAM: ORTHOPANTOGRAM/PANORAMIC COMPARISON:  CT of the face with contrast 03/12/2018. CT of the neck 02/27/2018. FINDINGS: Multiple dental caries are again seen. A large dental caries is present in the most posterior right maxillary molar with associated periapical lucency. The periapical lucency of tooth #13, responsible for the abscess, is less apparent on this study. Prominent dental caries involving the premolar teeth of the mandible bilaterally are associated with periapical lucencies, similar to the recent CT studies. Additional caries are present in the left first mandibular molar in the left central incisor of the mandible. IMPRESSION: 1. Periapical lucency involving the left maxilla is decreased in prominence since the  prior exam. 2. Extensive dental caries and periapical lucencies throughout the maxilla are similar to the prior study. Electronically Signed   By: San Morelle M.D.   On: 03/16/2018 10:07    [id number] except as listed in admit H&P  Blood pressure (!) 161/99, pulse 79, temperature 99.1 F (37.3 C), temperature source Oral, resp. rate 18, height 5\' 3"  (1.6 m), weight 91.4 kg, SpO2 94 %.  PHYSICAL EXAM: Overall appearance:  Healthy appearing, in no distress Head:  Normocephalic, atraumatic. Ears: External ears look healthy. Nose: External nose is healthy in appearance. Internal nasal exam free of any lesions or obstruction. Oral Cavity/Pharynx: Dentition in poor repair, multiple missing teeth as well.  Severe tenderness over the left maxillary premolar and periapical mucosa. Larynx/Hypopharynx: Deferred Neuro:  No identifiable neurologic deficits. Neck: No palpable neck masses.  Studies Reviewed: Maxillofacial CT reviewed.  Procedures: none   Assessment/Plan: Left maxillary premolar periapical abscess with surrounding inflammation.  She is being evaluated by Dr. Dorothyann Gibbs for dental care.  No need for ENT intervention.  Contact me again if any new issues arise.  Kristi Russell 03/16/2018, 11:56 AM

## 2018-03-16 NOTE — Consult Note (Signed)
DENTAL CONSULTATION  Date of Consultation:  03/16/2018 Patient Name:   Kristi Russell Date of Birth:   1960-11-01 Medical Record Number: [id number]  VITALS: BP (!) 161/99   Pulse 79   Temp 99.1 F (37.3 C) (Oral)   Resp 18   Ht 5\' 3"  (1.6 m)   Wt 91.4 kg   SpO2 94%   BMI 35.69 kg/m   CHIEF COMPLAINT: The patient was referred by Dr. Posey Pronto for a dental consultation.  HPI: Konya Fauble is a 58 year old female recently admitted for SIRS and dental abscess.  Patient has been complaining of upper left facial swelling for several months.  Patient was placed on IV antibiotic therapy.  Dental consultation requested to evaluate patient's poor dentition and facial swelling.    Patient has been complaining of left facial swelling for several months.  Patient went to [facility] long emergency department and was placed on oral antibiotics in December 2019.  The swelling worsened and the patient then presented to the [facility] emergency department.  An incision and drainage procedure was performed the patient was placed on clindamycin oral antibiotic therapy.  The swelling persisted and the patient also developed abdominal pain and spotting.  Patient then went to [facility] emergency department again and was admitted on 03/12/2018 and placed on IV antibiotic therapy.  A dental consultation was then requested to evaluate for dental etiology and provide treatment as indicated.  Patient has been experiencing dull achy pain in an intermittent fashion that reaches intensity of 7 out of 10 but is currently 0 out of 10.  Patient indicates that aspirin helps some with the discomfort.  Patient indicates that the pain has been spontaneous at times.  Patient has not been to a dentist for greater than 10 years.  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  The patient denies having any partial dentures.  PROBLEM LIST: Patient Active Problem List   Diagnosis Date  Noted  . Acute cystitis without hematuria 03/13/2018  . Tooth abscess 03/13/2018  . SIRS (systemic inflammatory response syndrome) ([place]) 03/12/2018  . Small bowel obstruction ([place]) 03/10/2017  . 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 Retinal detachment 01/12/2016  . Hypertension     PMH: Past Medical History:  Diagnosis Date  . 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 Headache    "weekly" (03/10/2017)  . Hypertension 1990s  . 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 Truman Hayward on [place].  Could not afford to go back for treatment.    PSH: Past Surgical History:  Procedure Laterality Date  . ABDOMINAL HYSTERECTOMY  2009   She thinks TAH/BSO:  [place], [place].  Dr. Lacinda Axon  . CESAREAN SECTION  1996  . LAPAROTOMY N/A 03/09/2017   Procedure: EXPLOR LAP, SMALL BOWEL RESECTION W/ ANASTOMOSIS, LYSIS OF ADHESIONS;  Surgeon: Kinsinger, Arta Bruce, MD;  Location: [place];  Service: General;  Laterality: N/A;    ALLERGIES: No Known Allergies  MEDICATIONS: Current Facility-Administered Medications  Medication Dose Route Frequency Provider Last Rate Last Dose  . acetaminophen (TYLENOL) tablet 650 mg  650 mg Oral Q6H PRN Rise Patience, MD   650 mg at 03/15/18 1922   Or  . acetaminophen (TYLENOL) suppository 650 mg  650 mg Rectal Q6H PRN Rise Patience, MD      .  cefdinir (OMNICEF) capsule 300 mg  300 mg Oral Q12H Lavina Hamman, MD   300 mg at 03/16/18 1308  . insulin aspart (novoLOG) injection 0-9 Units  0-9 Units Subcutaneous TID WC Rise Patience, MD   1 Units at 03/16/18 [phone]  . metroNIDAZOLE (FLAGYL) tablet 500 mg  500 mg Oral Q8H Lavina Hamman, MD   500 mg at 03/16/18 4696  . ondansetron (ZOFRAN) tablet 4 mg  4 mg Oral Q6H PRN Rise Patience, MD       Or  . ondansetron [facility]) injection 4 mg  4 mg Intravenous Q6H PRN Rise Patience, MD   4  mg at 03/15/18 0557    LABS: Lab Results  Component Value Date   WBC 5.1 03/14/2018   HGB 10.9 (L) 03/14/2018   HCT 34.0 (L) 03/14/2018   MCV 99.4 03/14/2018   PLT 214 03/14/2018      Component Value Date/Time   NA 140 03/14/2018 1139   NA 144 04/04/2017 1117   K 4.1 03/14/2018 1139   CL 112 (H) 03/14/2018 1139   CO2 25 03/14/2018 1139   GLUCOSE 146 (H) 03/14/2018 1139   BUN 8 03/14/2018 1139   BUN 3 (L) 04/04/2017 1117   CREATININE 0.68 03/14/2018 1139   CALCIUM 9.5 03/14/2018 1139   GFRNONAA >60 03/14/2018 1139   GFRAA >60 03/14/2018 1139   No results found for: INR, PROTIME No results found for: PTT  SOCIAL HISTORY: Social History   Socioeconomic History  . Marital status: Divorced    Spouse name: Not on file  . Number of children: 3  . Years of education: 2 years college  . Highest education level: Not on file  Occupational History  . Occupation: unemployed    Comment: Unemployed since June 2017.  Worked at [employer] in housekeeping  Social Needs  . Financial resource strain: Not on file  . Food insecurity:    Worry: Not on file    Inability: Not on file  . Transportation needs:    Medical: Not on file    Non-medical: Not on file  Tobacco Use  . Smoking status: Current Every Day Smoker    Packs/day: 0.50    Years: 38.00    Pack years: 19.00    Types: Cigarettes  . Smokeless tobacco: Never Used  Substance and Sexual Activity  . Alcohol use: Yes    Comment: occasionally  . Drug use: No  . Sexual activity: Not Currently  Lifestyle  . Physical activity:    Days per week: Not on file    Minutes per session: Not on file  . Stress: Not on file  Relationships  . Social connections:    Talks on phone: Not on file    Gets together: Not on file    Attends religious service: Not on file    Active member of club or organization: Not on file    Attends meetings of clubs or organizations: Not on file    Relationship status: Not on file  . Intimate  partner violence:    Fear of current or ex partner: Not on file    Emotionally abused: Not on file    Physically abused: Not on file    Forced sexual activity: Not on file  Other Topics Concern  . Not on file  Social History Narrative   Originally form [place]   Born in [place].   Moved to [place] since 1989 to be near mother and have  daughter go to school out of the city.   Has been in [place] for 4 years.   Daughter is a Investment banker, operational here.   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    FAMILY HISTORY: Family History  Problem Relation Age of Onset  . Heart disease Mother   . Hypertension Mother   . Stroke Mother        Sudden cardiac arrest vs. stroke  . Diabetes Sister   . Hypertension Sister   . Kidney disease Brother        on Dialysis  . Hypertension Daughter   . Obstructive Sleep Apnea Brother   . Breast cancer Maternal Grandmother     REVIEW OF SYSTEMS: Reviewed with the patient as per History of present illness. 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    DENTAL HISTORY: CHIEF COMPLAINT: The patient was referred by Dr. Posey Pronto for a dental consultation.  HPI: Glorianna Gott is a 58 year old female recently admitted for SIRS and dental abscess.  Patient has been complaining of upper left facial swelling for several months.  Patient was placed on IV antibiotic therapy.  Dental consultation requested to evaluate patient's poor dentition and facial swelling.    Patient has been complaining of left facial swelling for several months.  Patient went to [facility] long emergency department and was placed on oral antibiotics in December 2019.  The swelling worsened and the patient then presented to the [facility] emergency department.  An incision and drainage procedure was performed the patient was placed on clindamycin oral antibiotic therapy.  The swelling persisted and the patient also developed  abdominal pain and spotting.  Patient then went to [facility] emergency department again and was admitted on 03/12/2018 and placed on IV antibiotic therapy.  A dental consultation was then requested to evaluate for dental etiology and provide treatment as indicated.  Patient has been experiencing dull achy pain in an intermittent fashion that reaches intensity of 7 out of 10 but is currently 0 out of 10.  Patient indicates that aspirin helps some with the discomfort.  Patient indicates that the pain has been spontaneous at times.  Patient has not been to a dentist for greater than 10 years.  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  The patient denies having any partial dentures.  DENTAL EXAMINATION: GENERAL: The patient is a well-developed, well-nourished female no acute distress. HEAD AND NECK: Patient does have left facial paralysis secondary to forceps delivery during the birthing procedure by patient report.  Patient denies having any arm or left leg paralysis or paresis. INTRAORAL EXAM: Patient has normal saliva.  There is buccal vestibule swelling in the area of tooth numbers 13 and 14.  This is tender to palpation. This is non-fluctuant. DENTITION: The patient has multiple missing teeth and multiple retained root segments.  Tooth #17 and 32 are impacted.  The patient has multiple malpositioned teeth. The patient has multiple diastemas. PERIODONTAL: Patient has chronic periodontitis with plaque and calculus accumulations, generalized gingival recession, and moderate to severe bone loss.  Significant tooth mobility is noted. DENTAL CARIES/SUBOPTIMAL RESTORATIONS: Multiple dental caries are noted. ENDODONTIC: The patient has a history of acute pulpitis symptoms.  Patient has multiple areas of periapical pathology and radiolucency. CROWN AND BRIDGE: There are no crown or bridge restorations noted. PROSTHODONTIC: Patient denies  having  partial dentures. OCCLUSION: Patient has a poor occlusal scheme secondary to multiple missing teeth, multiple retained root segments, multiple diastemas, supraeruption and drifting of the unopposed teeth into the edentulous areas, and lack of replacement of missing teeth with dental prostheses.  RADIOGRAPHIC INTERPRETATION: An orthopantogram was taken today on 03/16/2018. There are multiple missing teeth.  There are multiple retained root segments.  Tooth numbers 17 and 32 are impacted.  There are multiple areas of periapical pathology and radiolucency.  Multiple dental caries are noted.  Multiple diastemas are noted.  Multiple malpositioned teeth are noted.  Radiographic calculus is noted.  There is moderate to severe bone loss noted.   ASSESSMENTS: 1.  History of Facial swelling 2.  Acute pulpitis  3.  Chronic apical periodontitis 4.  Maxillary left buccal swelling 5.  Dental caries 6.  Multiple retained root segments 7.  Multiple missing teeth 8.  Multiple diastemas 9.  Supra eruption drifting of the unopposed teeth into the edentulous areas 10.  Chronic periodontitis of bone loss 11.  Generalized gingival recession 12.  Accretions 13.  Tooth mobility 14.  Poor occlusal scheme and malocclusion 15.  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 I discussed the risks, benefits, and complications of various treatment options with the patient in relationship to her medical and dental conditions, history of left facial swelling, and impacted wisdom teeth. We discussed various treatment options to include no treatment, total and subtotal  extractions with alveoloplasty, pre-prosthetic surgery as indicated, periodontal therapy, dental restorations, root canal therapy, crown and bridge therapy, implant therapy, and replacement of missing teeth as indicated.  We discussed referral to an oral surgeon.  The patient currently wishes to proceed with extraction of  remaining teeth with alveoloplasty and pre-prosthetic surgery.  Due to the complexity of the anticipated extraction procedures, the patient will be referred to Dr. Frederik Schmidt, oral surgeon, for evaluation for extraction of remaining teeth with alveoloplasty with general anesthesia or IV sedation as indicated.  This referral will be as an outpatient.  The patient will  then need to follow-up with a dentist of her choice for fabrication of upper and lower complete dentures after adequate healing.   2. Discussion of findings with medical team and coordination of future medical and dental care as needed.     Lenn Cal, DDS

## 2018-03-16 NOTE — Care Management Note (Addendum)
Case Management Note  Patient Details  Name: Kristi Russell MRN: [id number] Date of Birth: December 16, 1960  Subjective/Objective:    Pt admitted with SIRS. She is from home with family.  DME: none PCP: none                Action/Plan: Pt was active with [facility] in the past. CM inquired about going to the clinic again and patient refused. CM inquired about one of the [facility] and she was in agreement. CM was able to get her an appt at [facility]. Information on the AVS. Pt can use [facility] pharmacy for her medications. CM following for further d/c needs, pt may need medications assistance at d/c depending on timing of d/c.  Addendum (1515): pt discharging home with self care. CM faxed prescriptions to [facility] pharmacy per pt request. Pt has transportation home.  Expected Discharge Date:                  Expected Discharge Plan:  Home/Self Care  In-House Referral:     Discharge planning Services  CM Consult, [facility]  Post Acute Care Choice:    Choice offered to:     DME Arranged:    DME Agency:     HH Arranged:    HH Agency:     Status of Service:  In process, will continue to follow  If discussed at Long Length of Stay Meetings, dates discussed:    Additional Comments:  Pollie Friar, RN 03/16/2018, 12:12 PM

## 2018-03-16 NOTE — Discharge Summary (Signed)
[facility] Discharge Summary   Patient: Kristi Russell YKD:[id number]   PCP: No primary care provider on file. DOB: [birth year]   Date of admission: 03/12/2018   Date of discharge:  03/16/2018    Discharge Diagnoses:  Principal diagnosis Sepsis secondary to facial cellulitis with left maxillary periosteal abscess as well as UTI. Principal Problem:   SIRS (systemic inflammatory response syndrome) (HCC) Active Problems:   Hypertension   Acute cystitis without hematuria   Tooth abscess   Admitted From: home Disposition:  home  Recommendations for Outpatient Follow-up:  1. Follow-up with PCP in 1 month, oral surgery call in 1 to 2 days and dentistry following that.  Follow-up [place] Follow up on 04/08/2018.   Why:  8:30 for hospital follow up- ,will see Dr. Ferdinand Lango information: [address] [place] [address] [phone]       Frederik Schmidt, MD Follow up.   Specialty:  Oral Surgery Why:  Call for oral surgery consultation appointment with an 1 to 2 days after discharge.   Contact information: [address] [place] [address] [phone]          Diet recommendation: cardiac diet  Activity: The patient is advised to gradually reintroduce usual activities.  Discharge Condition: good  Code Status: full code  History of present illness: As per the H and P dictated on admission, "Kristi Russell is a 58 y.o. female with history of left-sided paralysis since birth, hypertension, prediabetes presents to the ER because of increasing pain in the left lower quadrant with some vaginal spotting noticed over the last 1 week.  Has been having subjective feeling of fever chills.  Denies vomiting or diarrhea had some nausea.  Had come to the ER 2 weeks ago with left facial swelling and at that time patient had CAT scan done and showed some abscess which was drained in the ER and sent home.   Patient states the left facial swelling has declined but feels some not around the left gingival area.  ED Course: In the ER patient was tachycardic with temperature 102 F UA is consistent with UTI.  CT abdomen and pelvis was unremarkable.  Patient did complain of some vaginal spotting but has had hysterectomy.  Per the ER patient pelvic exam was unremarkable.  Blood cultures urine cultures were sent and since patient was febrile tachycardic concerning for developing sepsis started on ceftriaxone.  Since patient has had recent gingival abscess CT maxillofacial was done which shows left maxillary periosteal abscess."  Hospital Course:  Summary of her active problems in the hospital is as following. 1. sepsis secondary to UTI and also left maxillary periosteal abscess.  With cellulitis patient has been placed on ceftriaxone and Flagyl. With negative culture Transition to oral. ENT as well as dentistry consulted, appreciate assistance. ENT feels that the patient does not require any procedure from their side and the abscess does not require any drainage anymore. Dentistry.  The patient and recommended outpatient oral surgery follow-up for dental extraction. Appreciate their assistance.  2. Hypertension on losartan and hydrochlorothiazide.  Blood pressure is rather stable here in the hospital without any antihypertensive medication. We will currently hold them on discharge. Follow-up with PCP in 1 month.  3. History of prediabetes on metformin. 4. History of birth defect with left-sided hemiplegia. 5. Tobacco abuse-advised to quit smoking.  Patient was ambulatory without any assistance. On the day of the discharge the patient's vitals were  stable , and no other acute medical condition were reported by patient. the patient was felt safe to be discharge at home with family.  Consultants: Dentistry, ENT Procedures: none  DISCHARGE MEDICATION: Allergies as of 03/16/2018   No Known  Allergies     Medication List    STOP taking these medications   cetirizine 10 MG tablet Commonly known as:  ZYRTEC   clindamycin 150 MG capsule Commonly known as:  CLEOCIN   HYDROcodone-acetaminophen 5-325 MG tablet Commonly known as:  NORCO/VICODIN   losartan-hydrochlorothiazide 100-25 MG tablet Commonly known as:  HYZAAR   losartan-hydrochlorothiazide 50-12.5 MG tablet Commonly known as:  HYZAAR   mometasone 50 MCG/ACT nasal spray Commonly known as:  NASONEX   naproxen 500 MG tablet Commonly known as:  NAPROSYN   varenicline 0.5 MG X 11 & 1 MG X 42 tablet Commonly known as:  CHANTIX STARTING MONTH PAK   varenicline 1 MG tablet Commonly known as:  CHANTIX CONTINUING MONTH PAK     TAKE these medications   artificial tears Oint ophthalmic ointment Commonly known as:  LACRILUBE Place ribbon in left eye and patch closed before bedtime daily   cefdinir 300 MG capsule Commonly known as:  OMNICEF Take 1 capsule (300 mg total) by mouth 2 (two) times daily for 4 days.   metroNIDAZOLE 500 MG tablet Commonly known as:  FLAGYL Take 1 tablet (500 mg total) by mouth 3 (three) times daily for 4 days.      No Known Allergies Discharge Instructions    Diet general   Complete by:  As directed    Discharge instructions   Complete by:  As directed    It is important that you read the given instructions as well as go over your medication list with RN to help you understand your care after this hospitalization.  Discharge Instructions: Please follow-up with PCP in 1-2 weeks  Please request your primary care physician to go over all Hospital Tests and Procedure/Radiological results at the follow up. Please get all Hospital records sent to your PCP by signing hospital release before you go home.   Do not take more than prescribed Pain, Sleep and Anxiety Medications. You were cared for by a hospitalist during your hospital stay. If you have any questions about your discharge  medications or the care you received while you were in the hospital after you are discharged, you can call the unit @UNIT @ you were admitted to and ask to speak with the hospitalist on call if the hospitalist that took care of you is not available.  Once you are discharged, your primary care physician will handle any further medical issues. Please note that NO REFILLS for any discharge medications will be authorized once you are discharged, as it is imperative that you return to your primary care physician (or establish a relationship with a primary care physician if you do not have one) for your aftercare needs so that they can reassess your need for medications and monitor your lab values. You Must read complete instructions/literature along with all the possible adverse reactions/side effects for all the Medicines you take and that have been prescribed to you. Take any new Medicines after you have completely understood and accept all the possible adverse reactions/side effects. Wear Seat belts while driving. If you have smoked or chewed Tobacco in the last 2 yrs please stop smoking and/or stop any Recreational drug use.  If you drink alcohol, please moderate the use and do not drive,  operating heavy machinery, perform activities at heights, swimming or participation in water activities or provide baby sitting services under influence.   Increase activity slowly   Complete by:  As directed      Discharge Exam: Filed Weights   03/14/18 0331 03/15/18 0600 03/16/18 0500  Weight: 89 kg 91 kg 91.4 kg   Vitals:   03/15/18 2303 03/16/18 0732  BP: 131/83 (!) 161/99  Pulse: 92 79  Resp: (!) 21 18  Temp: 98.6 F (37 C) 99.1 F (37.3 C)  SpO2: 97% 94%   General: Appear in no distress, no Rash; Oral Mucosa moist. Cardiovascular: S1 and S2 Present, no Murmur, no JVD Respiratory: Bilateral Air entry present and Clear to Auscultation, no Crackles, no wheezes Abdomen: Bowel Sound present, Soft and no  tenderness Extremities: no Pedal edema, no calf tenderness Neurology: Grossly no focal neuro deficit.  The results of significant diagnostics from this hospitalization (including imaging, microbiology, ancillary and laboratory) are listed below for reference.    Significant Diagnostic Studies: Dg Orthopantogram  Result Date: 03/16/2018 CLINICAL DATA:  Swelling of the left face. Dental caries. Infection. EXAM: ORTHOPANTOGRAM/PANORAMIC COMPARISON:  CT of the face with contrast 03/12/2018. CT of the neck 02/27/2018. FINDINGS: Multiple dental caries are again seen. A large dental caries is present in the most posterior right maxillary molar with associated periapical lucency. The periapical lucency of tooth #13, responsible for the abscess, is less apparent on this study. Prominent dental caries involving the premolar teeth of the mandible bilaterally are associated with periapical lucencies, similar to the recent CT studies. Additional caries are present in the left first mandibular molar in the left central incisor of the mandible. IMPRESSION: 1. Periapical lucency involving the left maxilla is decreased in prominence since the prior exam. 2. Extensive dental caries and periapical lucencies throughout the maxilla are similar to the prior study. Electronically Signed   By: San Morelle M.D.   On: 03/16/2018 10:07   Dg Chest 2 View  Result Date: 03/12/2018 CLINICAL DATA:  Fever. Patient reports abdominal and back pain. EXAM: CHEST - 2 VIEW COMPARISON:  Chest radiograph 03/10/2017 FINDINGS: Unchanged heart size and mediastinal contours with borderline cardiomegaly. No pulmonary edema, confluent airspace disease, pleural effusion or pneumothorax. No acute osseous abnormalities are seen. IMPRESSION: No acute pulmonary process. Electronically Signed   By: Keith Rake M.D.   On: 03/12/2018 19:07   Ct Soft Tissue Neck W Contrast  Result Date: 02/27/2018 CLINICAL DATA:  58 y/o F; swelling of the  left side of the upper jaw. EXAM: CT NECK WITH CONTRAST TECHNIQUE: Multidetector CT imaging of the neck was performed using the standard protocol following the bolus administration of intravenous contrast. CONTRAST:  5mL OMNIPAQUE IOHEXOL 300 MG/ML  SOLN COMPARISON:  None. FINDINGS: Pharynx and larynx: Normal. No mass or swelling. Salivary glands: No inflammation, mass, or stone. Thyroid: 16 mm nodule with coarse calcifications along posterior margin of right lobe of thyroid (series 3, image 66). Lymph nodes: None enlarged or abnormal density. Vascular: Negative. Limited intracranial: Negative. Visualized orbits: Negative. Mastoids and visualized paranasal sinuses: Moderate mucosal thickening of the left maxillary sinus alveolar recess with multiple adjacent periapical cysts, possibly odontogenic mucositis. Skeleton: No acute or aggressive process. Upper chest: Mild centrilobular emphysema of the lung apices. Other: 19 x 10 x 17 mm abscess (AP x ML x CC series 3, image 29 and series 6, image 18) overlying the left maxillary alveolar bone, probably related to periapical disease of left maxillary premolars  and molars. Surrounding inflammation in the superficial soft tissues of the left face. IMPRESSION: 1. 19 mm odontogenic abscess overlying the left anterolateral maxillary alveolar bone. Diffuse dental disease with multiple caries and periapical cysts. 2. Left maxillary sinus mucosal thickening with adjacent prominent periapical dental disease, possibly odontogenic maxillary sinusitis. 3. 16 mm nodule with calcifications in right lobe of thyroid. Thyroid ultrasound is recommended on a nonemergent basis. Electronically Signed   By: Kristine Garbe M.D.   On: 02/27/2018 19:15   Ct Abdomen Pelvis W Contrast  Result Date: 03/12/2018 CLINICAL DATA:  Pt reports abd pain, back pain and loss of appetite for the past 2 weeks. Also reports vaginal bleeding when she wipes for 2 weeks. Denies heavy bleeding but  states she has had a hysterectomy EXAM: CT ABDOMEN AND PELVIS WITH CONTRAST TECHNIQUE: Multidetector CT imaging of the abdomen and pelvis was performed using the standard protocol following bolus administration of intravenous contrast. CONTRAST:  11mL OMNIPAQUE IOHEXOL 300 MG/ML  SOLN COMPARISON:  CT, 03/09/2017 FINDINGS: Lower chest: No acute abnormality. Hepatobiliary: No focal liver abnormality is seen. No gallstones, gallbladder wall thickening, or biliary dilatation. Pancreas: Unremarkable. No pancreatic ductal dilatation or surrounding inflammatory changes. Spleen: Normal in size without focal abnormality. Adrenals/Urinary Tract: 12 mm left adrenal nodule, stable. Normal right adrenal gland. Kidneys normal size, orientation and position with symmetric enhancement and excretion. No masses, stones or hydronephrosis. Ureters are normal in course and in caliber. Bladder is unremarkable. Stomach/Bowel: Small bowel anastomosis staple line in the right anterior pelvis. No bowel dilation, wall thickening or adjacent inflammation. Stomach is unremarkable. Normal appendix visualized. Vascular/Lymphatic: Mild aortic atherosclerosis. No other vascular abnormality. No adenopathy. Reproductive: Status post hysterectomy. No adnexal masses. Other: No hernia. Hernia noted on the prior CT has been repaired. No ascites. Musculoskeletal: No acute or significant osseous findings. IMPRESSION: 1. No acute findings within the abdomen or pelvis. 2. 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 3. Status post hysterectomy and small bowel anastomosis. 4. Mild aortic atherosclerosis. Electronically Signed   By: Lajean Manes M.D.   On: 03/12/2018 19:40   Ct Maxillofacial W Contrast  Result Date: 03/12/2018 CLINICAL DATA:  Fever and dental pain EXAM: CT MAXILLOFACIAL WITH CONTRAST TECHNIQUE: Multidetector CT imaging of the maxillofacial structures was performed with intravenous contrast. Multiplanar CT image reconstructions were  also generated. CONTRAST:  25mL OMNIPAQUE IOHEXOL 300 MG/ML  SOLN COMPARISON:  Facial CT 02/27/2018 FINDINGS: Osseous: No facial fracture. Dental: Poor dentition with multiple large periapical lucencies worst at the roots of the bilateral maxillary and mandibular molars and premolars. There is left facial soft tissue swelling that is in close proximity to periapical lucencies of teeth 13 and 14. Small subperiosteal abscess measures approximately 7 x 5 mm. Orbits: The globes are intact. Normal appearance of the intra- and extraconal fat. Symmetric extraocular muscles. Sinuses: No fluid levels or advanced mucosal thickening. Soft tissues: Normal visualized extracranial soft tissues. Limited intracranial: Normal. IMPRESSION: 1. Odontogenic cellulitis and subcentimeter subperiosteal abscess at the left maxilla, likely originating from periapical lucency at the root of teeth 13 and 14. 2. Severely poor dentition with multiple large periapical lucencies, worst at the roots of the bilateral maxillary and mandibular molars and premolars. Electronically Signed   By: Ulyses Jarred M.D.   On: 03/12/2018 23:23    Microbiology: Recent Results (from the past 240 hour(s))  Blood culture (routine x 2)     Status: None (Preliminary result)   Collection Time: 03/12/18  5:43 PM  Result Value Ref Range Status   Specimen Description BLOOD LEFT ANTECUBITAL  Final   Special Requests   Final    BOTTLES DRAWN AEROBIC AND ANAEROBIC Blood Culture results may not be optimal due to an excessive volume of blood received in culture bottles Performed at [facility] [address], [place], [place] [address]    Culture NO GROWTH 4 DAYS  Final   Report Status PENDING  Incomplete  Blood culture (routine x 2)     Status: None (Preliminary result)   Collection Time: 03/12/18  5:43 PM  Result Value Ref Range Status   Specimen Description BLOOD RIGHT ANTECUBITAL  Final   Special Requests   Final    BOTTLES DRAWN AEROBIC AND  ANAEROBIC Blood Culture adequate volume Performed at [facility], [place] [address], [place], [place] [address]    Culture NO GROWTH 4 DAYS  Final   Report Status PENDING  Incomplete  Urine culture     Status: Abnormal   Collection Time: 03/12/18  5:43 PM  Result Value Ref Range Status   Specimen Description URINE, CLEAN CATCH  Final   Special Requests   Final    NONE Performed at [facility], [place] [address], [place], [place] [address]    Culture >=100,000 COLONIES/mL ESCHERICHIA COLI (A)  Final   Report Status 03/14/2018 FINAL  Final   Organism ID, Bacteria ESCHERICHIA COLI (A)  Final      Susceptibility   Escherichia coli - MIC*    AMPICILLIN >=32 RESISTANT Resistant     CEFAZOLIN <=4 SENSITIVE Sensitive     CEFTRIAXONE <=1 SENSITIVE Sensitive     CIPROFLOXACIN <=0.25 SENSITIVE Sensitive     GENTAMICIN <=1 SENSITIVE Sensitive     IMIPENEM <=0.25 SENSITIVE Sensitive     NITROFURANTOIN <=16 SENSITIVE Sensitive     TRIMETH/SULFA >=320 RESISTANT Resistant     AMPICILLIN/SULBACTAM 16 INTERMEDIATE Intermediate     PIP/TAZO <=4 SENSITIVE Sensitive     Extended ESBL NEGATIVE Sensitive     * >=100,000 COLONIES/mL ESCHERICHIA COLI     Labs: CBC: Recent Labs  Lab 03/12/18 1324 03/13/18 0652 03/14/18 1139  WBC 5.7 5.1 5.1  NEUTROABS 5.0 3.7  --   HGB 13.1 11.1* 10.9*  HCT 41.7 34.1* 34.0*  MCV 98.1 98.3 99.4  PLT 268 222 828   Basic Metabolic Panel: Recent Labs  Lab 03/12/18 1324 03/13/18 0652 03/14/18 1139  NA 139 142 140  K 3.9 3.4* 4.1  CL 105 108 112*  CO2 26 28 25   GLUCOSE 136* 170* 146*  BUN 9 10 8   CREATININE 0.67 0.75 0.68  CALCIUM 10.4* 9.5 9.5  MG  --   --  2.1   Liver Function Tests: Recent Labs  Lab 03/12/18 1324 03/13/18 0652  AST 36 11*  ALT 17 12  ALKPHOS 55 41  BILITOT 0.8 0.6  PROT 6.8 5.3*  ALBUMIN 3.6 2.7*   Recent Labs  Lab 03/12/18 1324  LIPASE 21   No results for input(s): AMMONIA in the last 168 hours. Cardiac  Enzymes: No results for input(s): CKTOTAL, CKMB, CKMBINDEX, TROPONINI in the last 168 hours. BNP (last 3 results) No results for input(s): BNP in the last 8760 hours. CBG: Recent Labs  Lab 03/15/18 1220 03/15/18 1657 03/15/18 2114 03/16/18 0727 03/16/18 1217  GLUCAP 126* 110* 147* 127* 124*   Time spent: 35 minutes  Signed:  Berle Mull  [facility]  03/16/2018  , 3:28  PM

## 2018-03-17 LAB — CULTURE, BLOOD (ROUTINE X 2)
Culture: NO GROWTH
Culture: NO GROWTH
Special Requests: ADEQUATE

## 2018-03-30 ENCOUNTER — Ambulatory Visit: Payer: No Typology Code available for payment source | Admitting: Family Medicine

## 2018-04-08 ENCOUNTER — Encounter: Payer: Self-pay | Admitting: Critical Care Medicine

## 2018-04-08 ENCOUNTER — Inpatient Hospital Stay: Payer: No Typology Code available for payment source | Admitting: Critical Care Medicine

## 2018-04-08 NOTE — Progress Notes (Deleted)
Subjective:    Patient ID: Kristi Russell, female    DOB: 1960/06/06, 58 y.o.   MRN: [id number]  57 y.o.F with recent sepsis d/t dental abscess and UTI adm 03/12/18 - 03/16/18 Had been in ED 12/13 and 12/20 prior with dental abscess and I/D Rx clindamycin then returned with facial cellulitis 03/12/18   Discharge Diagnoses:  Principal diagnosis Sepsis secondary to facial cellulitis with left maxillary periosteal abscess as well as UTI. Principal Problem:   SIRS (systemic inflammatory response syndrome) (HCC) Active Problems:   Hypertension   Acute cystitis without hematuria   Tooth abscess   Admitted From: home Disposition:  home  Recommendations for Outpatient Follow-up:  1. Follow-up with PCP in 1 month, oral surgery call in 1 to 2 days and dentistry following that.  Follow-up [place] Follow up on 04/08/2018.  Why:  8:30 for hospital follow up- ,will see Dr. Ferdinand Lango information: [address] [place] [address] [phone]       Frederik Schmidt, MD Follow up.  Specialty:  Oral Surgery Why:  Call for oral surgery consultation appointment with an 1 to 2 days after discharge.   Contact information: [address] [place] [address] [phone]        Diet recommendation: cardiac diet  Activity: The patient is advised to gradually reintroduce usual activities.  Discharge Condition: good  Code Status: full code  History of present illness: As per the H and P dictated on admission, "Kristi Russell a 58 y.o.femalewithhistory of left-sided paralysis since birth, hypertension, prediabetes presents to the ER because of increasing pain in the left lower quadrant with some vaginal spotting noticed over the last 1 week. Has been having subjective feeling of fever chills. Denies vomiting or diarrhea had some nausea. Had come to the ER 2 weeks ago with left facial  swelling and at that time patient had CAT scan done and showed some abscess which was drained in the ER and sent home. Patient states the left facial swelling has declined but feels some not around the left gingival area.  ED Course:In the ER patient was tachycardic with temperature 102 F UA is consistent with UTI. CT abdomen and pelvis was unremarkable. Patient did complain of some vaginal spotting but has had hysterectomy. Per the ER patient pelvic exam was unremarkable. Blood cultures urine cultures were sent and since patient was febrile tachycardic concerning for developing sepsis started on ceftriaxone. Since patient has had recent gingival abscess CT maxillofacial was done which shows left maxillary periosteal abscess."  Hospital Course:  Summary of her active problems in the hospital is as following. 1. sepsis secondary to UTI and also left maxillary periosteal abscess.  With cellulitis patient has been placed on ceftriaxone and Flagyl. With negative cultureTransition to oral. ENT as well as dentistry consulted, appreciate assistance. ENT feels that the patient does not require any procedure from their side and the abscess does not require any drainage anymore. Dentistry.  The patient and recommended outpatient oral surgery follow-up for dental extraction. Appreciate their assistance.  2. Hypertension on losartan and hydrochlorothiazide.  Blood pressure is rather stable here in the hospital without any antihypertensive medication. We will currently hold them on discharge. Follow-up with PCP in 1 month.  3. History of prediabetes on metformin. 4. History of birth defect with left-sided hemiplegia. 5. Tobacco abuse-advised to quit smoking.  Patient was ambulatory without any assistance. On the day of the discharge the  patient's vitals were stable , and no other acute medical condition were reported by patient. the patient was felt safe to be discharge at home with  family.  Consultants: Dentistry, ENT  Here today in f/u post hosp.  Also needs PCP to establish     Review of Systems     Objective:   Physical Exam        Assessment & Plan:

## 2019-03-14 IMAGING — CT CT MAXILLOFACIAL W/ CM
3 of 4 series · 16 of 47 positions shown, 19 images · IV contrast (omnipaque)
Comparison: Facial CT 02/27/2018

CLINICAL DATA: Fever and dental pain

EXAM:
CT MAXILLOFACIAL WITH CONTRAST
TECHNIQUE: Multidetector CT imaging of the maxillofacial structures was
performed with intravenous contrast. Multiplanar CT image
reconstructions were also generated.
CONTRAST:  75mL OMNIPAQUE IOHEXOL 300 MG/ML  SOLN

[Series 3: facial/orbits w 2.0 st · axial · 0.30mm/px · z∈[-223,-73]mm · 11 of 87 slices shown, 14 images]
[im 6/87  brain]
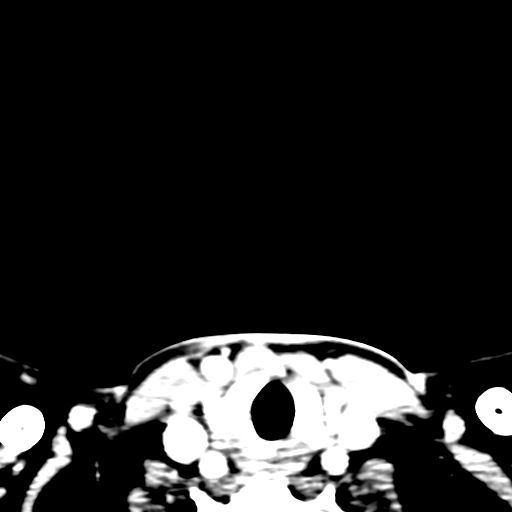
[im 6/87  bone]
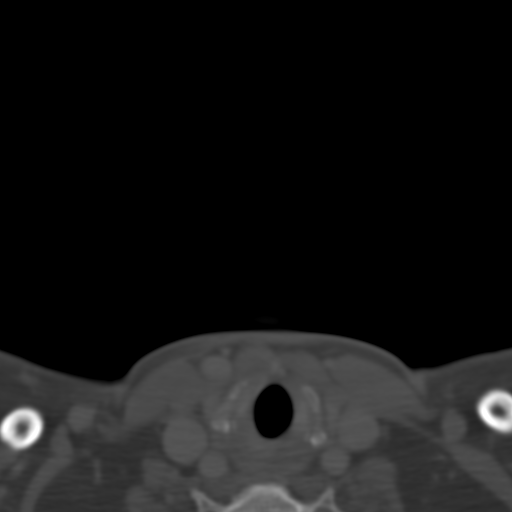
[im 12/87  bone]
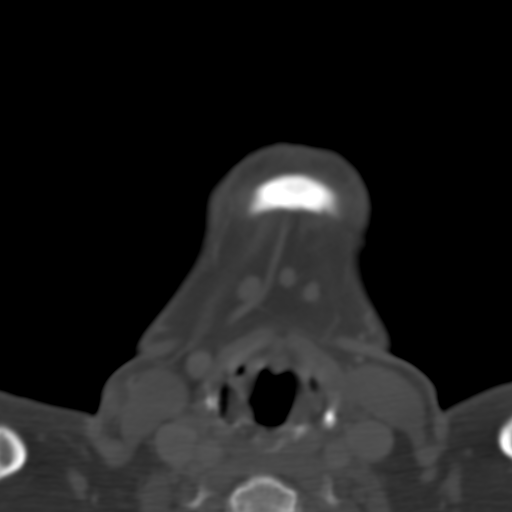
[im 21/87  bone]
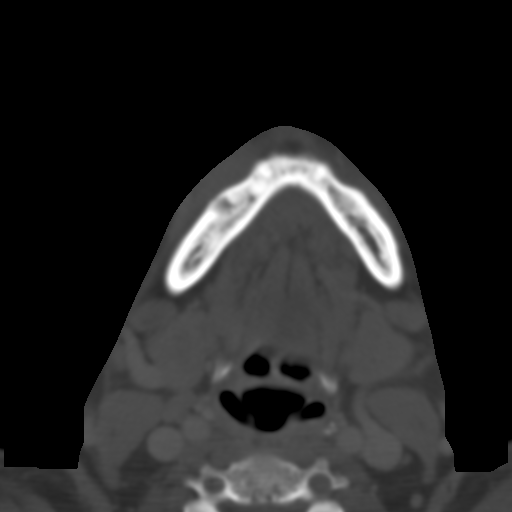
[im 27/87  bone]
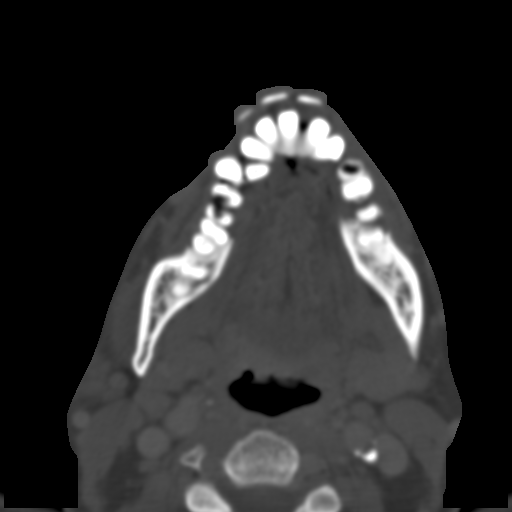
[im 36/87  brain]
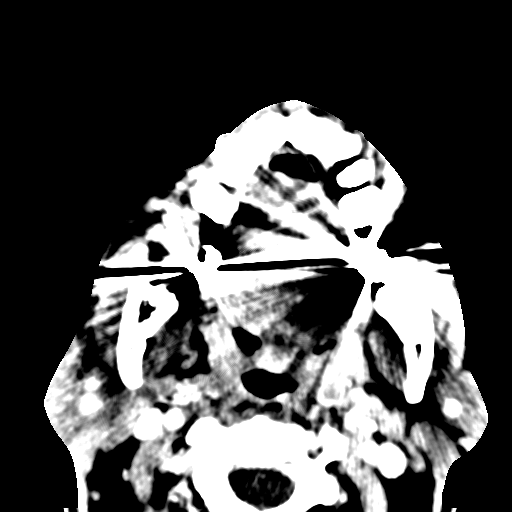
[im 36/87  bone]
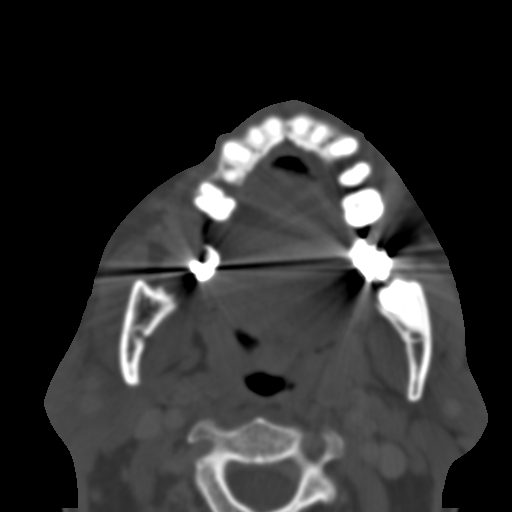
[im 45/87  bone]
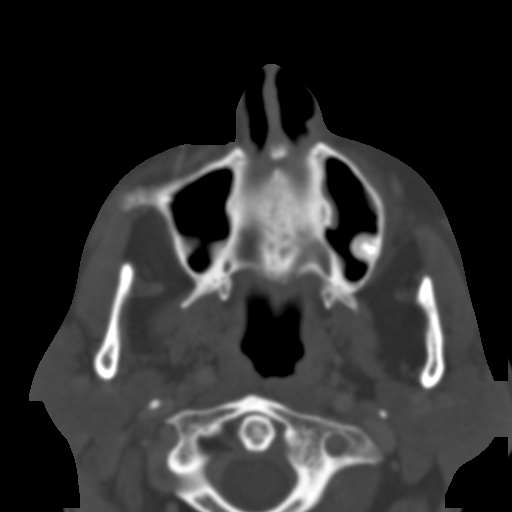
[im 51/87  bone]
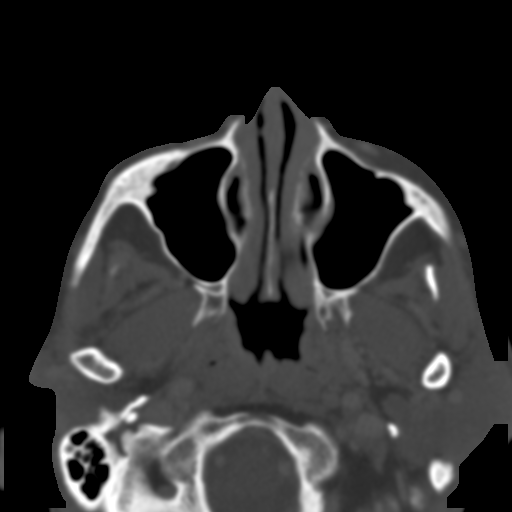
[im 60/87  bone]
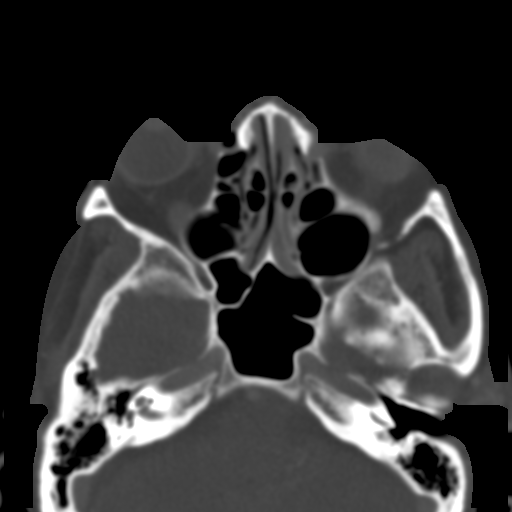
[im 66/87  brain]
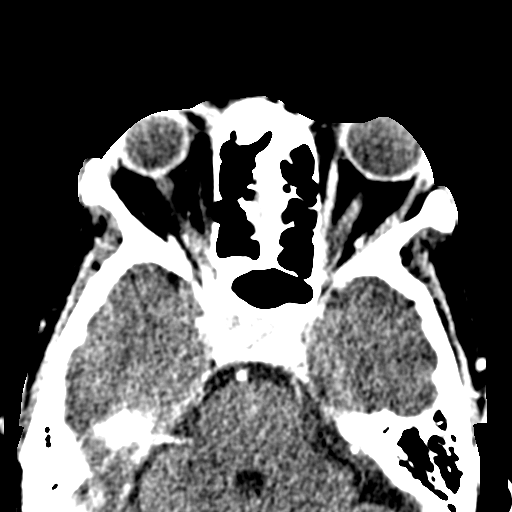
[im 66/87  bone]
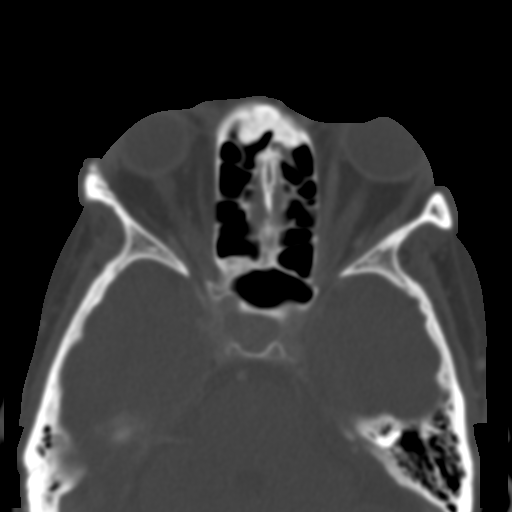
[im 75/87  bone]
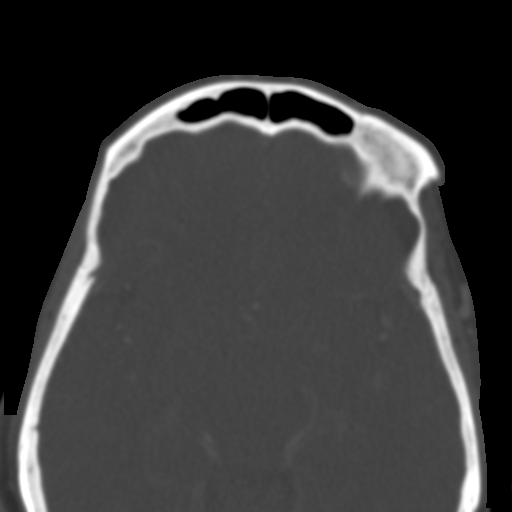
[im 81/87  bone]
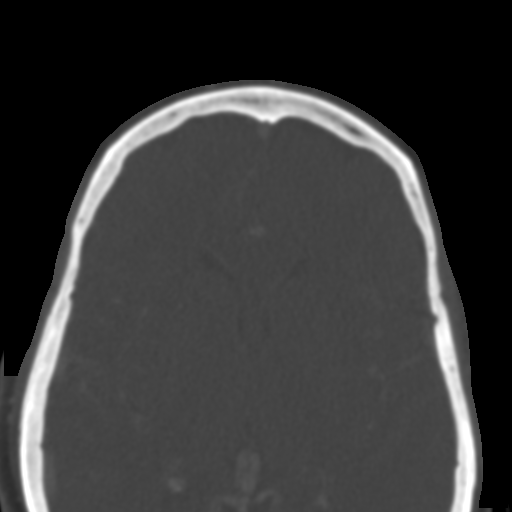

[Series 7: coronal soft tissue · coronal · 0.34mm/px · 3 of 75 slices shown]
[im 25/75  bone]
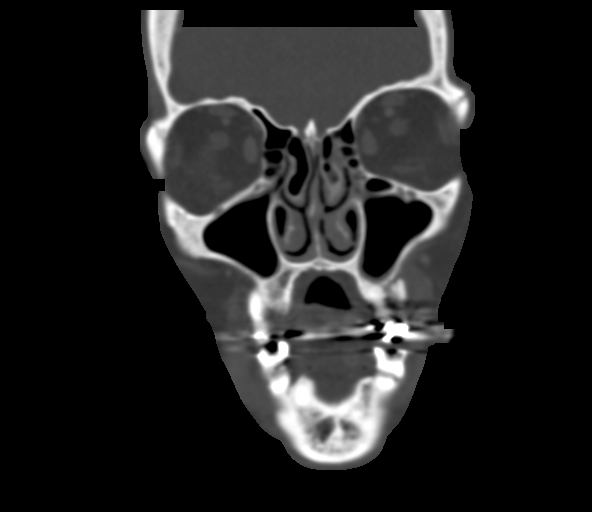
[im 33/75  bone]
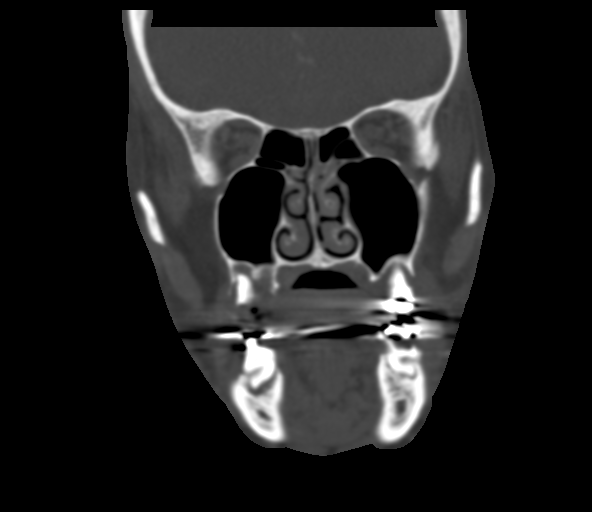
[im 42/75  bone]
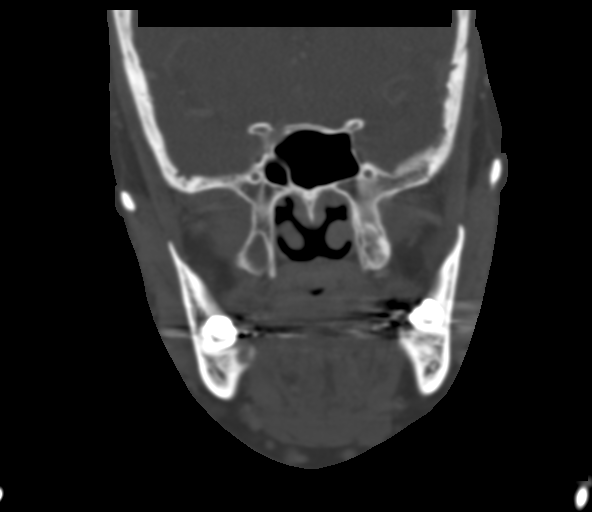

[Series 10: sagittal bone · sagittal · 0.34mm/px · 2 of 87 slices shown]
[im 29/87  bone]
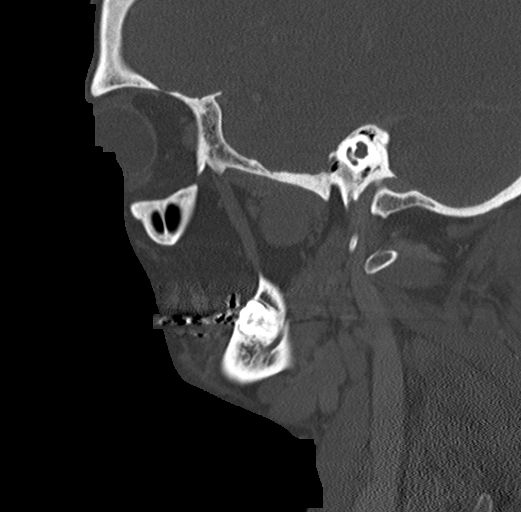
[im 58/87  bone]
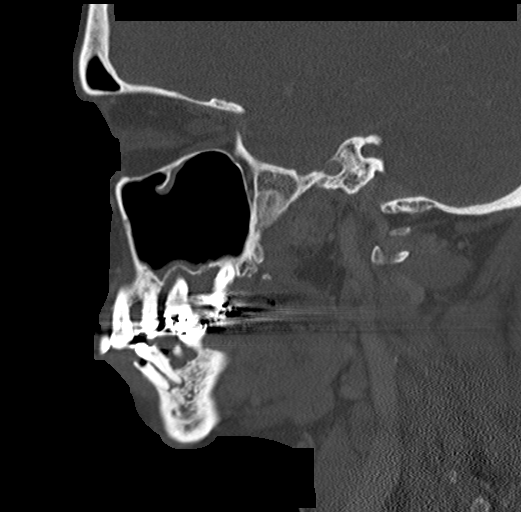

[16 of 47 positions shown; findings below may reference images not displayed]

FINDINGS: Osseous: No facial fracture.

Dental: Poor dentition with multiple large periapical lucencies
worst at the roots of the bilateral maxillary and mandibular molars
and premolars. There is left facial soft tissue swelling that is in
close proximity to periapical lucencies of teeth 13 and 14. Small
subperiosteal abscess measures approximately 7 x 5 mm.

Orbits: The globes are intact. Normal appearance of the intra- and
extraconal fat. Symmetric extraocular muscles.

Sinuses: No fluid levels or advanced mucosal thickening.

Soft tissues: Normal visualized extracranial soft tissues.

Limited intracranial: Normal.
IMPRESSION: 1. Odontogenic cellulitis and subcentimeter subperiosteal abscess at
the left maxilla, likely originating from periapical lucency at the
root of teeth 13 and 14.
2. Severely poor dentition with multiple large periapical lucencies,
worst at the roots of the bilateral maxillary and mandibular molars
and premolars.

## 2019-03-18 IMAGING — DX DG ORTHOPANTOGRAM /PANORAMIC
1 series · 1 of 1 positions shown · non-contrast
Comparison: CT of the face with contrast 03/12/2018. CT of the neck
02/27/2018.

CLINICAL DATA: Swelling of the left face. Dental caries. Infection.

EXAM:
ORTHOPANTOGRAM/PANORAMIC

[view not recorded]
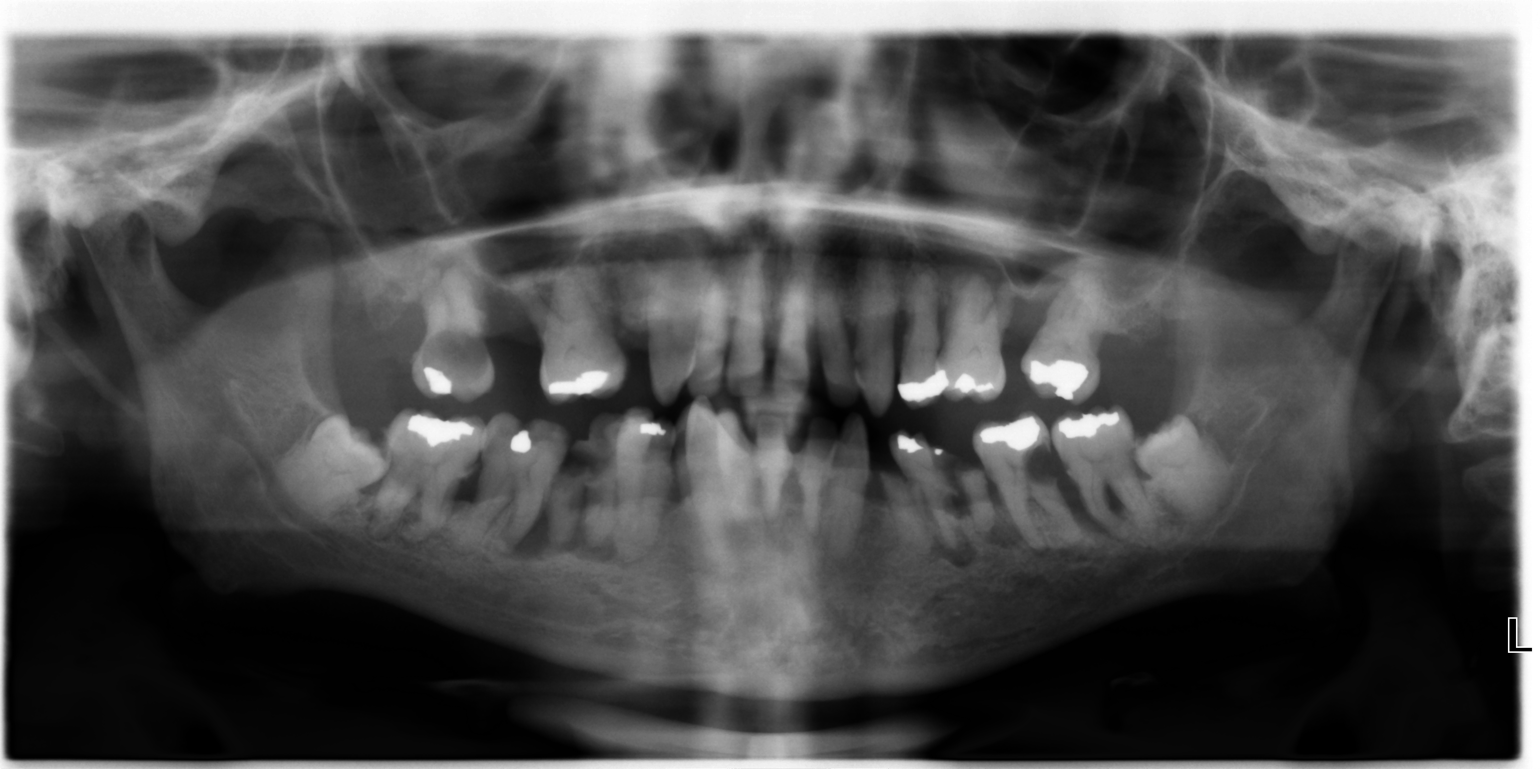

[1 of 1 positions shown; findings below may reference images not displayed]

FINDINGS: Multiple dental caries are again seen. A large dental caries is
present in the most posterior right maxillary molar with associated
periapical lucency. The periapical lucency of tooth #13, responsible
for the abscess, is less apparent on this study. Prominent dental
caries involving the premolar teeth of the mandible bilaterally are
associated with periapical lucencies, similar to the recent CT
studies. Additional caries are present in the left first mandibular
molar in the left central incisor of the mandible.
IMPRESSION: 1. Periapical lucency involving the left maxilla is decreased in
prominence since the prior exam.
2. Extensive dental caries and periapical lucencies throughout the
maxilla are similar to the prior study.

## 2020-03-01 ENCOUNTER — Telehealth (INDEPENDENT_AMBULATORY_CARE_PROVIDER_SITE_OTHER): Payer: Self-pay

## 2020-03-01 NOTE — Telephone Encounter (Signed)
Error closed
